# Patient Record
Sex: Male | Born: 1955 | Race: White | Hispanic: No | Marital: Single | Smoking: Never smoker
Health system: Southern US, Community
[De-identification: ages and names within clinical notes are randomized; demographics above are authoritative.]

## PROBLEM LIST (undated history)

## (undated) DIAGNOSIS — E785 Hyperlipidemia, unspecified: Secondary | ICD-10-CM

## (undated) DIAGNOSIS — E119 Type 2 diabetes mellitus without complications: Secondary | ICD-10-CM

## (undated) DIAGNOSIS — E559 Vitamin D deficiency, unspecified: Secondary | ICD-10-CM

## (undated) DIAGNOSIS — R0989 Other specified symptoms and signs involving the circulatory and respiratory systems: Secondary | ICD-10-CM

## (undated) DIAGNOSIS — H409 Unspecified glaucoma: Secondary | ICD-10-CM

## (undated) HISTORY — DX: Type 2 diabetes mellitus without complications: E11.9

## (undated) HISTORY — DX: Other specified symptoms and signs involving the circulatory and respiratory systems: R09.89

## (undated) HISTORY — DX: Unspecified glaucoma: H40.9

## (undated) HISTORY — DX: Vitamin D deficiency, unspecified: E55.9

## (undated) HISTORY — DX: Hyperlipidemia, unspecified: E78.5

---
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
## 2002-11-07 HISTORY — PX: HERNIA REPAIR: SHX51

## 2003-06-24 ENCOUNTER — Ambulatory Visit (HOSPITAL_COMMUNITY): Admission: RE | Admit: 2003-06-24 | Discharge: 2003-06-24 | Payer: Self-pay | Admitting: *Deleted

## 2007-03-21 ENCOUNTER — Ambulatory Visit: Payer: Self-pay | Admitting: Gastroenterology

## 2007-03-29 ENCOUNTER — Ambulatory Visit: Payer: Self-pay | Admitting: Gastroenterology

## 2011-03-25 NOTE — Op Note (Signed)
   NAME:  Patrick Mcintosh, Patrick Mcintosh                             ACCOUNT NO.:  [id number]   MEDICAL RECORD NO.:  [id number]                   PATIENT TYPE:  AMB   LOCATION:  DAY                                  FACILITY:  [facility]   PHYSICIAN:  Vikki Ports, M.D.         DATE OF BIRTH:  [birth year]   DATE OF PROCEDURE:  06/24/2003  DATE OF DISCHARGE:                                 OPERATIVE REPORT   PREOPERATIVE DIAGNOSIS:  Ventral hernia incarcerated.   POSTOPERATIVE DIAGNOSIS:  Ventral hernia incarcerated.   PROCEDURE:  Laparoscopic ventral hernia repair with mesh.   SURGEON:  Vikki Ports, M.D.   ASSISTANT:  None.   ANESTHESIA:  General.   DESCRIPTION OF PROCEDURE:  The patient was taken to the operating room,  placed in the supine position and after adequate general anesthesia was  induced, the abdomen was prepped and draped in the normal sterile fashion.  Using a 10 mm incision in the left upper quadrant and the Optiview zero  scope technique, the camera was introduced into the abdomen.  Pneumoperitoneum was then obtained with continuous flow carbon dioxide.  Under direct vision, two 5 mm ports were placed in the left abdomen. Hernia  contents were then reduced using blunt and sharp dissection and this all  contained omentum. This was reduced into the abdominal cavity, the hernia  defect was measured to be 4 x 6 cm. A piece of 4 inch x 6 inch Parietex mesh  was then tacked with 0 Novofil sutures and placed in the abdomen. It was  tacked to the anterior abdominal wall with the store suture passer. This  covered the defect well with 2-3 cm in every direction. It was then packed  in place using the auto suture tacker. Pneumoperitoneum was released,  trocars were removed and the skin was closed with subcuticular 4-0 Monocryl  and injected with Marcaine. The patient tolerated the procedure well and  went to PACU in good condition.      Vikki Ports, M.D.    KRH/MEDQ  D:  06/24/2003  T:  06/24/2003  Job:  [id number]

## 2013-11-25 DIAGNOSIS — E785 Hyperlipidemia, unspecified: Secondary | ICD-10-CM | POA: Insufficient documentation

## 2013-11-25 DIAGNOSIS — H409 Unspecified glaucoma: Secondary | ICD-10-CM | POA: Insufficient documentation

## 2013-11-25 DIAGNOSIS — E559 Vitamin D deficiency, unspecified: Secondary | ICD-10-CM | POA: Insufficient documentation

## 2013-11-25 DIAGNOSIS — E119 Type 2 diabetes mellitus without complications: Secondary | ICD-10-CM | POA: Insufficient documentation

## 2013-11-25 DIAGNOSIS — R0989 Other specified symptoms and signs involving the circulatory and respiratory systems: Secondary | ICD-10-CM | POA: Insufficient documentation

## 2013-11-26 ENCOUNTER — Encounter: Payer: Self-pay | Admitting: Internal Medicine

## 2014-11-26 ENCOUNTER — Encounter: Payer: Self-pay | Admitting: Internal Medicine

## 2016-06-07 ENCOUNTER — Emergency Department (HOSPITAL_COMMUNITY)
Admission: EM | Admit: 2016-06-07 | Discharge: 2016-06-07 | Disposition: A | Discharge status: Home / Self Care | Payer: Self-pay | Attending: Emergency Medicine | Admitting: Emergency Medicine

## 2016-06-07 ENCOUNTER — Encounter (HOSPITAL_COMMUNITY): Payer: Self-pay | Admitting: Emergency Medicine

## 2016-06-07 ENCOUNTER — Emergency Department (HOSPITAL_COMMUNITY): Payer: Self-pay

## 2016-06-07 DIAGNOSIS — I10 Essential (primary) hypertension: Secondary | ICD-10-CM | POA: Insufficient documentation

## 2016-06-07 DIAGNOSIS — E119 Type 2 diabetes mellitus without complications: Secondary | ICD-10-CM | POA: Insufficient documentation

## 2016-06-07 DIAGNOSIS — K047 Periapical abscess without sinus: Secondary | ICD-10-CM | POA: Insufficient documentation

## 2016-06-07 DIAGNOSIS — Z79899 Other long term (current) drug therapy: Secondary | ICD-10-CM | POA: Insufficient documentation

## 2016-06-07 LAB — CBC
HEMATOCRIT: 44 % (ref 39.0–52.0)
Hemoglobin: 15.8 g/dL (ref 13.0–17.0)
MCH: 32.8 pg (ref 26.0–34.0)
MCHC: 35.9 g/dL (ref 30.0–36.0)
MCV: 91.3 fL (ref 78.0–100.0)
PLATELETS: 254 10*3/uL (ref 150–400)
RBC: 4.82 MIL/uL (ref 4.22–5.81)
RDW: 11.7 % (ref 11.5–15.5)
WBC: 9.7 10*3/uL (ref 4.0–10.5)

## 2016-06-07 LAB — BASIC METABOLIC PANEL
Anion gap: 13 (ref 5–15)
BUN: 9 mg/dL (ref 6–20)
CALCIUM: 8.9 mg/dL (ref 8.9–10.3)
CO2: 23 mmol/L (ref 22–32)
CREATININE: 0.77 mg/dL (ref 0.61–1.24)
Chloride: 97 mmol/L — ABNORMAL LOW (ref 101–111)
GFR calc Af Amer: >60 mL/min (ref >60)
GLUCOSE: 256 mg/dL — AB (ref 65–99)
Potassium: 4 mmol/L (ref 3.5–5.1)
SODIUM: 133 mmol/L — AB (ref 135–145)

## 2016-06-07 MED ORDER — IBUPROFEN 400 MG PO TABS: 400.0000 mg | ORAL_TABLET | Freq: Four times a day (QID) | ORAL | 0 refills | Status: DC | PRN

## 2016-06-07 MED ORDER — KETOROLAC TROMETHAMINE 30 MG/ML IJ SOLN
30.0000 mg | Freq: Once | INTRAMUSCULAR | Status: AC
  Administered 2016-06-07: 30 mg via INTRAVENOUS
  Filled 2016-06-07: qty 1

## 2016-06-07 MED ORDER — PENICILLIN V POTASSIUM 500 MG PO TABS
500.0000 mg | ORAL_TABLET | Freq: Once | ORAL | Status: AC
  Administered 2016-06-07: 500 mg via ORAL
  Filled 2016-06-07: qty 1

## 2016-06-07 MED ORDER — ACETAMINOPHEN 500 MG PO TABS
1000.0000 mg | ORAL_TABLET | Freq: Once | ORAL | Status: AC
  Administered 2016-06-07: 1000 mg via ORAL
  Filled 2016-06-07: qty 2

## 2016-06-07 MED ORDER — IOPAMIDOL (ISOVUE-300) INJECTION 61%
75.0000 mL | Freq: Once | INTRAVENOUS | Status: AC | PRN
  Administered 2016-06-07: 75 mL via INTRAVENOUS

## 2016-06-07 MED ORDER — BUPIVACAINE HCL (PF) 0.5 % IJ SOLN
20.0000 mL | Freq: Once | INTRAMUSCULAR | Status: AC
  Administered 2016-06-07: 10 mL
  Filled 2016-06-07: qty 30

## 2016-06-07 MED ORDER — HYDROCODONE-ACETAMINOPHEN 5-325 MG PO TABS: 1.0000 | ORAL_TABLET | Freq: Four times a day (QID) | ORAL | 0 refills | Status: DC | PRN

## 2016-06-07 MED ORDER — SODIUM CHLORIDE 0.9 % IV BOLUS (SEPSIS)
1000.0000 mL | Freq: Once | INTRAVENOUS | Status: AC
Start: 2016-06-07 — End: 2016-06-07
  Administered 2016-06-07: 1000 mL via INTRAVENOUS

## 2016-06-07 MED ORDER — PENICILLIN V POTASSIUM 500 MG PO TABS: 500.0000 mg | ORAL_TABLET | Freq: Four times a day (QID) | ORAL | 0 refills | Status: DC

## 2016-06-07 NOTE — ED Notes (Signed)
Pt in CT.

## 2016-06-07 NOTE — ED Notes (Signed)
Dr. Venora Maples made aware of supplies in the room.

## 2016-06-07 NOTE — ED Provider Notes (Signed)
[facility] DEPT Provider Note   CSN: [id number] Arrival date & time: 06/07/16  [id number]  First Provider Contact:  First MD Initiated Contact with Patient 06/07/16 [phone]        History   Chief Complaint Chief Complaint  Patient presents with  . Oral Swelling    HPI Patrick Mcintosh is a 60 y.o. male.  Patient reports increasing dental pain over the past week and now has developed facial swelling of the past 24 hours.  He reports this is left-sided facial swelling and left-sided dental pain.  He states he recently lost his job and lost his insurance so he has not seen a Pharmacist, community.  He denies fever or chills.  His pain is moderate in severity.  He has some trismus.  He states no difficulty breathing or swallowing at this time.   The history is provided by the patient and medical records.    Past Medical History:  Diagnosis Date  . Glaucoma   . Hyperlipidemia   . Labile hypertension   . Type II or unspecified type diabetes mellitus without mention of complication, not stated as uncontrolled   . Vitamin D deficiency     Patient Active Problem List   Diagnosis Date Noted  . Labile hypertension   . Hyperlipidemia   . Type II or unspecified type diabetes mellitus without mention of complication, not stated as uncontrolled   . Vitamin D deficiency   . Glaucoma     Past Surgical History:  Procedure Laterality Date  . HERNIA REPAIR  2004  . [place] Medications    Prior to Admission medications   Medication Sig Start Date End Date Taking? Authorizing Provider  benzocaine (ORAJEL) 10 % mucosal gel Use as directed 1 application in the mouth or throat as needed for mouth pain.   Yes Historical Provider, MD  cetirizine (ZYRTEC) 10 MG tablet Take 10 mg by mouth daily.   Yes Historical Provider, MD  naproxen sodium (ANAPROX) 220 MG tablet Take 440 mg by mouth 3 (three) times daily with meals as needed (dental pain.).   Yes Historical Provider, MD    HYDROcodone-acetaminophen (NORCO/VICODIN) 5-325 MG tablet Take 1 tablet by mouth every 6 (six) hours as needed for moderate pain. 06/07/16   Jola Schmidt, MD  ibuprofen (ADVIL,MOTRIN) 400 MG tablet Take 1 tablet (400 mg total) by mouth every 6 (six) hours as needed. 06/07/16   Jola Schmidt, MD  penicillin v potassium (VEETID) 500 MG tablet Take 1 tablet (500 mg total) by mouth 4 (four) times daily. 06/07/16   Jola Schmidt, MD    Family History Family History  Problem Relation Age of Onset  . Pneumonia Father     Social History Social History  Substance Use Topics  . Smoking status: Never Smoker  . Smokeless tobacco: Never Used  . Alcohol use Yes     Comment: occ beer     Allergies   Review of patient's allergies indicates no known allergies.   Review of Systems Review of Systems  All other systems reviewed and are negative.    Physical Exam Updated Vital Signs BP 126/80 (BP Location: Left Arm)   Pulse 109   Temp 99 F (37.2 C) (Oral)   Resp 18   Ht 5\' 10"  (1.778 m)   Wt 170 lb (77.1 kg)   SpO2 95%   BMI 24.39 kg/m   Physical Exam  Constitutional: He is oriented to person,  place, and time. He appears well-developed and well-nourished.  HENT:  Head: Normocephalic.  Poor dentition throughout.  Mild facial swelling on the left with some swelling just under his mandible at the angle on the left.  No swelling under his tongue.  Anterior neck is soft without erythema.  No obvious gingival fluctuance  Eyes: EOM are normal.  Neck: Normal range of motion.  Pulmonary/Chest: Effort normal.  Abdominal: He exhibits no distension.  Musculoskeletal: Normal range of motion.  Neurological: He is alert and oriented to person, place, and time.  Psychiatric: He has a normal mood and affect.  Nursing note and vitals reviewed.    ED Treatments / Results  Labs (all labs ordered are listed, but only abnormal results are displayed) Labs Reviewed  BASIC METABOLIC PANEL - Abnormal;  Notable for the following:       Result Value   Sodium 133 (*)    Chloride 97 (*)    Glucose, Bld 256 (*)    All other components within normal limits  CBC    EKG  EKG Interpretation None       Radiology Ct Soft Tissue Neck W Contrast  Result Date: 06/07/2016 CLINICAL DATA:  LEFT lower dental pain and swelling. EXAM: CT NECK WITH CONTRAST TECHNIQUE: Multidetector CT imaging of the neck was performed using the standard protocol following the bolus administration of intravenous contrast. CONTRAST:  32mL ISOVUE-300 IOPAMIDOL (ISOVUE-300) INJECTION 61% COMPARISON:  None. FINDINGS: Pharynx and larynx: Periapical lucencies are seen in the LEFT mandible surrounding the LEFT lobe the second molar, there is also a large dental caries associated with this tooth. In the LEFT masticator and parapharyngeal spaces, there is significant inflammatory change, extending into the LEFT submandibular space, over the LEFT neck. Small area of hypoattenuation, medial to the LEFT mandibular ramus, 10 x 12 mm, could represent developing abscess. Mass effect on the airway. No impending airway obstruction however. Fullness of the LEFT aryepiglottic fold does not extend to the larynx or below. Normal epiglottis. Within limits for assessment due to dental amalgam, normal sublingual space. Salivary glands: LEFT submandibular gland is in developed by inflammatory change, may be slightly enlarged, but is otherwise intrinsically normal. Thyroid: Unremarkable Lymph nodes: Reactive level 1 and level 2 nodes bilaterally, greater on the LEFT. Vascular: Unremarkable. Limited intracranial: Unremarkable Visualized orbits: Unremarkable Mastoids and visualized paranasal sinuses: No layering fluid. Skeleton: Spondylosis.  No worrisome osseous lesion. Upper chest: No nodules or infiltrates. IMPRESSION: Regional inflammatory change surrounding LEFT second mandibular molar dental disease. Possible small abscess medial to the LEFT mandibular  ramus. Extensive inflammatory change in the LEFT parapharyngeal, masticator, and submandibular spaces, with mild mass effect on the airway. See discussion above. Electronically Signed   By: Staci Righter M.D.   On: 06/07/2016 12:42    Procedures .Nerve Block Performed by: Jola Schmidt Authorized by: Jola Schmidt    Consent: Verbal consent obtained. Required items: required blood products, implants, devices, and special equipment available Time out: Immediately prior to procedure a "time out" was called to verify the correct patient, procedure, equipment, support staff and site/side marked as required. Indication: Dental pain  Nerve block body site: Dental nerve left  Preparation: Patient was prepped and draped in the usual sterile fashion. Needle gauge: 24 G Location technique: anatomical landmarks Local anesthetic: Marcaine  Anesthetic total: 3 ml Outcome: pain improved Patient tolerance: Patient tolerated the procedure well with no immediate complications.     Medications Ordered in ED Medications  bupivacaine (MARCAINE) 0.5 %  injection 20 mL (10 mLs Infiltration Given 06/07/16 1021)  penicillin v potassium (VEETID) tablet 500 mg (500 mg Oral Given 06/07/16 1020)  iopamidol (ISOVUE-300) 61 % injection 75 mL (75 mLs Intravenous Contrast Given 06/07/16 1211)  ketorolac (TORADOL) 30 MG/ML injection 30 mg (30 mg Intravenous Given 06/07/16 1244)  acetaminophen (TYLENOL) tablet 1,000 mg (1,000 mg Oral Given 06/07/16 1245)  sodium chloride 0.9 % bolus 1,000 mL (1,000 mLs Intravenous New Bag/Given 06/07/16 1242)     Initial Impression / Assessment and Plan / ED Course  I have reviewed the triage vital signs and the nursing notes.  Pertinent labs & imaging results that were available during my care of the patient were reviewed by me and considered in my medical decision making (see chart for details).  Clinical Course    Patient will need close follow-up with a dentist.  I do not think he  needs admission to the hospital at this time.  No stridor or airway compromise.  Patient understands to return to the ER for new or worsening symptoms.  Patient will be started on antibiotics.  No clear-cut abscess that needs drainage at this time.  Final Clinical Impressions(s) / ED Diagnoses   Final diagnoses:  Dental infection    New Prescriptions New Prescriptions   HYDROCODONE-ACETAMINOPHEN (NORCO/VICODIN) 5-325 MG TABLET    Take 1 tablet by mouth every 6 (six) hours as needed for moderate pain.   IBUPROFEN (ADVIL,MOTRIN) 400 MG TABLET    Take 1 tablet (400 mg total) by mouth every 6 (six) hours as needed.   PENICILLIN V POTASSIUM (VEETID) 500 MG TABLET    Take 1 tablet (500 mg total) by mouth 4 (four) times daily.     Jola Schmidt, MD 06/07/16 1259

## 2016-06-07 NOTE — ED Triage Notes (Signed)
Patient c/o left lower dental abscess that has increased in swelling and pain over the past couple days.

## 2016-06-27 ENCOUNTER — Other Ambulatory Visit: Payer: Self-pay | Admitting: Internal Medicine

## 2017-02-02 ENCOUNTER — Encounter: Payer: Self-pay | Admitting: Gastroenterology

## 2021-11-22 ENCOUNTER — Ambulatory Visit (INDEPENDENT_AMBULATORY_CARE_PROVIDER_SITE_OTHER): Payer: BC Managed Care – PPO | Admitting: Plastic Surgery

## 2021-11-22 ENCOUNTER — Other Ambulatory Visit: Payer: Self-pay

## 2021-11-22 VITALS — BP 162/105 | HR 89 | Ht 70.0 in | Wt 187.2 lb

## 2021-11-22 DIAGNOSIS — C44320 Squamous cell carcinoma of skin of unspecified parts of face: Secondary | ICD-10-CM

## 2021-11-27 NOTE — Progress Notes (Addendum)
° °  Referring Provider No referring provider defined for this encounter.   CC:  Squamous cell carcinoma left forehead   Patrick Mcintosh is an 66 y.o. male.  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  This was excised today by Dr. Winifred Olive.  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  He wants to continue working and has limited days that he will come in.  He is aware that he will require reconstruction of his left forehead wound with plastics and ENT.  Dr. Winifred Olive has suggested a sentinel node biopsy and will do it we will discuss that with ENT.  No Known Allergies  Outpatient Encounter Medications as of 11/22/2021  Medication Sig   benzocaine (ORAJEL) 10 % mucosal gel Use as directed 1 application in the mouth or throat as needed for mouth pain.   cetirizine (ZYRTEC) 10 MG tablet Take 10 mg by mouth daily.   HYDROcodone-acetaminophen (NORCO/VICODIN) 5-325 MG tablet Take 1 tablet by mouth every 6 (six) hours as needed for moderate pain.   ibuprofen (ADVIL,MOTRIN) 400 MG tablet Take 1 tablet (400 mg total) by mouth every 6 (six) hours as needed.   naproxen sodium (ANAPROX) 220 MG tablet Take 440 mg by mouth 3 (three) times daily with meals as needed (dental pain.).   penicillin v potassium (VEETID) 500 MG tablet Take 1 tablet (500 mg total) by mouth 4 (four) times daily.   No facility-administered encounter medications on file as of 11/22/2021.     Past Medical History:  Diagnosis Date   Glaucoma    Hyperlipidemia    Labile hypertension    Type II or unspecified type diabetes mellitus without mention of complication, not stated as uncontrolled    Vitamin D deficiency     Past Surgical History:  Procedure Laterality Date   HERNIA REPAIR  2004   STRABISMUS SURGERY  1958    Family History  Problem Relation Age of Onset   Pneumonia Father     Social History   Social History Narrative   Not on file     Review of  Systems General: Denies fevers, chills, weight loss CV: Denies chest pain, shortness of breath, palpitations   Physical Exam Vitals with BMI 11/22/2021 06/07/2016 06/07/2016  Height 5\' 10"  - -  Weight 187 lbs 3 oz - -  BMI 78.29 - -  Systolic 562 130 865  Diastolic 784 81 80  Pulse 89 101 109    General:  No acute distress,  Alert and oriented, Non-Toxic, Normal speech and affect HEENT large open wound left forehead exposed bone and a small area.  Assessment/Plan Large open wound with exposed bone left forehead after excision of squamous cell cancer.  Skin graft substitute or skin graft left forehead will plan reconstruction with likely Integra or other skin graft or skin graft substitute.  Since there is exposed bone we will have to cover this with a skin graft substitute or local tissue arrangement, or possible contralateral forehead flap.  We we will plan to coordinate with ENT.  Time based coding: 34minutes were spent with the patient.  Greater than 50% was spent on counseling cordination of care.  We discussed treatment options for reconstruction of left forehead wound.  Lennice Sites 11/27/2021, 2:31 PM

## 2021-12-01 ENCOUNTER — Telehealth: Payer: Self-pay | Admitting: Plastic Surgery

## 2021-12-01 NOTE — Telephone Encounter (Signed)
I called the patient to discuss scheduling of his left forehead reconstruction to be coordinated with ENT, Dr. Constance Holster.  Patient stated he was seeing them on February 14.  He states that he was offered an appointment much sooner on January 19 but he was unable to make this appointment because he was on the road as a Administrator.  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  I gave him my office and mobile phone number so that he could contact me if he is able to move anything up.

## 2021-12-09 ENCOUNTER — Telehealth: Payer: Self-pay | Admitting: Plastic Surgery

## 2021-12-09 NOTE — Telephone Encounter (Signed)
Left message for patient on home number requesting a callback to inform us if he was able to reschedule his appt with ENT to an earlier date as the current appt of 2/14 will delay the surgery further.

## 2021-12-15 ENCOUNTER — Telehealth: Payer: Self-pay | Admitting: Plastic Surgery

## 2021-12-15 NOTE — Telephone Encounter (Signed)
Called patient to see if he had moved his appt up with Dr. Janeice Robinson office yet and he said no, he goes on 2/14 at 2:45. He would like surgery on 2/27 since he'll be back in town that day but it we cannot coordinate the case for the day, then it'll be another 2 weeks before he can be home long enough to do surgery. Advised we will do our very best to schedule this as soon as we can. Patient will let other office know this as well.

## 2021-12-30 ENCOUNTER — Other Ambulatory Visit: Payer: Self-pay

## 2021-12-30 ENCOUNTER — Encounter (HOSPITAL_BASED_OUTPATIENT_CLINIC_OR_DEPARTMENT_OTHER): Payer: Self-pay | Admitting: Otolaryngology

## 2022-01-04 ENCOUNTER — Telehealth: Payer: Self-pay | Admitting: Plastic Surgery

## 2022-01-04 NOTE — Telephone Encounter (Signed)
Spoke with Angela Nevin, surgery scheduler for Dr. Janeice Robinson office about date for surgery. 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 Based on the two surgeon's availability to coordinate, April 3 is the first date that meets patient's availability as well. Patient has been informed of the importance of having the open wound operated on.

## 2022-01-10 ENCOUNTER — Ambulatory Visit (HOSPITAL_BASED_OUTPATIENT_CLINIC_OR_DEPARTMENT_OTHER): Admission: RE | Admit: 2022-01-10 | Payer: BC Managed Care – PPO | Source: Home / Self Care | Admitting: Otolaryngology

## 2022-01-10 DIAGNOSIS — R0989 Other specified symptoms and signs involving the circulatory and respiratory systems: Secondary | ICD-10-CM

## 2022-01-10 SURGERY — EXCISION, MASS, HEAD
Anesthesia: General | Laterality: Left

## 2022-01-20 NOTE — H&P (Signed)
HPI:  ? ?Patrick Mcintosh is a 66 y.o. male who presents as a consult Patient.  ? ?Referring Provider: Pcp, Verify Patient Has ? ?Chief complaint: Skin cancer. ? ?HPI: Squamous cell cancer left forehead scalp skin. Referred here by plastic surgery for consideration of sentinel node biopsy. He is going to be scheduled for excision and reconstruction with Integra. He does not smoke. He is diabetic but reasonably well controlled. 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 He had 1 other skin cancer about 10 years prior. I do not have the pathology results. ? ?PMH/Meds/All/SocHx/FamHx/ROS:  ? ?History reviewed. No pertinent past medical history. ? ?History reviewed. No pertinent surgical history. ? ?No family history of bleeding disorders, wound healing problems or difficulty with anesthesia.  ? ?Social History  ? ?Socioeconomic History  ? Marital status: Unknown  ?Spouse name: Not on file  ? Number of children: Not on file  ? Years of education: Not on file  ? Highest education level: Not on file  ?Occupational History  ? Not on file  ?Tobacco Use  ? Smoking status: Never  ? Smokeless tobacco: Never  ?Substance and Sexual Activity  ? Alcohol use: Yes  ? Drug use: Not on file  ? Sexual activity: Not on file  ?Other Topics Concern  ? Not on file  ?Social History Narrative  ? Not on file  ? ?Social Determinants of Health  ? ?Financial Resource Strain: Not on file  ?Food Insecurity: Not on file  ?Transportation Needs: Not on file  ?Physical Activity: Not on file  ?Stress: Not on file  ?Social Connections: Not on file  ?Housing Stability: Not on file  ? ?Current Outpatient Medications:  ? atorvastatin (LIPITOR) 10 MG tablet, Take 1 tablet (10 mg total) by mouth nightly., Disp: , Rfl:  ? metFORMIN (GLUCOPHAGE) 1000 MG tablet, Take 1 tablet (1,000 mg total) by mouth 2 times daily., Disp: , Rfl:  ? ?A complete ROS was performed with pertinent positives/negatives noted in the HPI. The remainder of the ROS are  negative. ? ? ?Physical Exam:  ? ?Temp 98.5 ?F (36.9 ?C)  Ht 1.778 m ('5\' 10"'$ )  Wt 85.2 kg (187 lb 12.8 oz)  BMI 26.95 kg/m?  ? ?General: Healthy and alert, in no distress, breathing easily. Normal affect. In a pleasant mood. ?Head: Normocephalic, atraumatic. No masses, or scars. ?Eyes: Pupils are equal, and reactive to light. Vision is grossly intact. No spontaneous or gaze nystagmus. ?Ears: Ear canals are clear. Tympanic membranes are intact, with normal landmarks and the middle ears are clear and healthy. ?Hearing: Grossly normal. ?Nose: Nasal cavities are clear with healthy mucosa, no polyps or exudate. Airways are patent. ?Face: No masses or scars, facial nerve function is symmetric. ?Oral Cavity: No mucosal abnormalities are noted. Tongue with normal mobility. Dentition appears healthy. ?Oropharynx: Tonsils are symmetric. There are no mucosal masses identified. Tongue base appears normal and healthy. ?Larynx/Hypopharynx: deferred ?Chest: Deferred ?Neck: No palpable masses, no cervical adenopathy, no thyroid nodules or enlargement. ?Neuro: Cranial nerves II-XII with normal function. ?Balance: Normal gate. ?Other findings: none. ? ?Independent Review of Additional Tests or Records:  ?none ? ?Procedures:  ?Procedure note: ? ?Indications: Cerumen impaction ? ?Details of cerumen removal were discussed with the patient and all questions were answered. ? ?Procedure: ? ?Using the operating microscope, both sides were cleaned of cerumen using curettes. There was no signs of infection. Symptoms were releaved. ? ?He tolerated this procedure well. ?There were no  complications. ? ?Impression & Plans:  ?No palpable lymphadenopathy. Concern arose for sentinel node biopsy. I will make arrangements for a lymphoscintigram after I have reviewed the pathology. We can coordinate then to perform sentinel node biopsy at the same time as the plastic surgery procedure. ? ?

## 2022-01-24 ENCOUNTER — Ambulatory Visit (INDEPENDENT_AMBULATORY_CARE_PROVIDER_SITE_OTHER): Payer: BC Managed Care – PPO | Admitting: Plastic Surgery

## 2022-01-24 DIAGNOSIS — C44320 Squamous cell carcinoma of skin of unspecified parts of face: Secondary | ICD-10-CM

## 2022-01-24 NOTE — Progress Notes (Signed)
? ?    ? ?    Patient ID: Patrick Mcintosh, male    DOB: Oct 03, 1956, 66 y.o.   MRN: [id number] ? ?Chief complaint: Squamous cell cancer of left forehead ? ? ?History of Present Illness: ?Patrick Mcintosh is a 66 y.o.  male  with a history of left temple squamous cell carcinoma.  He presents for preoperative evaluation for upcoming procedure, left forehead reconstruction with graft (likely  Integra) and burring of focal margin along bone, scheduled for 02/07/22 with Dr.  Erin Hearing . ? ?The patient has not had problems with anesthesia.  ? ?Summary of Previous Visit: The patient was first seen on January 16.  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  He is aware that aware that this is delaying his care and could affect his final result and ability to clear the squamous cell cancer. ? ?Job: Trucker ? ?PMH Significant for: Hypertension ? ? ?Past Medical History: ?Allergies: ?No Known Allergies ? ?Current Medications: ? ?Current Outpatient Medications:  ?  atorvastatin (LIPITOR) 10 MG tablet, Take 10 mg by mouth daily., Disp: , Rfl:  ?  metFORMIN (GLUCOPHAGE) 1000 MG tablet, Take 1,000 mg by mouth 2 (two) times daily with a meal., Disp: , Rfl:  ? ?Past Medical Problems: ?Past Medical History:  ?Diagnosis Date  ? Glaucoma   ? Hyperlipidemia   ? Labile hypertension   ? Type II or unspecified type diabetes mellitus without mention of complication, not stated as uncontrolled   ? Vitamin D deficiency   ? ? ?Past Surgical History: ?Past Surgical History:  ?Procedure Laterality Date  ? HERNIA REPAIR  2004  ? [place]  ? ? ?Social History: ?Social History  ? ?Socioeconomic History  ? Marital status: Single  ?  Spouse name: Not on file  ? Number of children: Not on file  ? Years of education: Not on file  ? Highest education level: Not on file  ?Occupational History  ? Not on file  ?Tobacco Use  ? Smoking status: Never  ? Smokeless tobacco: Never  ?Substance and Sexual Activity  ?  Alcohol use: Yes  ?  Comment: occ beer  ? Drug use: No  ? Sexual activity: Not on file  ?Other Topics Concern  ? Not on file  ?Social History Narrative  ? Not on file  ? ?Social Determinants of Health  ? ?Financial Resource Strain: Not on file  ?Food Insecurity: Not on file  ?Transportation Needs: Not on file  ?Physical Activity: Not on file  ?Stress: Not on file  ?Social Connections: Not on file  ?Intimate Partner Violence: Not on file  ? ? ?Family History: ?Family History  ?Problem Relation Age of Onset  ? Pneumonia Father   ? ? ?Review of Systems: ?ROS ? ?Physical Exam: ?Vital Signs ?There were no vitals taken for this visit. ? ?Physical Exam  ?Phone visit.  Patient was unable to provide imaging. ? ?Assessment/Plan: ?The patient is scheduled for burring of focal margin and reconstruction with Dr.  Erin Hearing  as well as sentinel node procedure with Dr. Constance Holster.  Risks, benefits, and alternatives of procedure discussed, questions answered and consent obtained.   ? ?Smoking Status: None; ? ? ?Caprini Score: 6;  Will defer to ENT on prophylaxis choice ? ? ?Post-op Rx sent to pharmacy: oxycodone ? ? ? ? ?Electronically signed by: Lennice Sites, MD 01/24/2022 8:45 AM ?

## 2022-01-24 NOTE — H&P (View-Only) (Signed)
? ?    ? ?    Patient ID: Patrick Mcintosh, male    DOB: 08/16/1956, 66 y.o.   MRN: [id number] ? ?Chief complaint: Squamous cell cancer of left forehead ? ? ?History of Present Illness: ?Patrick Mcintosh is a 66 y.o.  male  with a history of left temple squamous cell carcinoma.  He presents for preoperative evaluation for upcoming procedure, left forehead reconstruction with graft (likely  Integra) and burring of focal margin along bone, scheduled for 02/07/22 with Dr.  Erin Hearing . ? ?The patient has not had problems with anesthesia.  ? ?Summary of Previous Visit: The patient was first seen on January 16.  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  He is aware that aware that this is delaying his care and could affect his final result and ability to clear the squamous cell cancer. ? ?Job: Trucker ? ?PMH Significant for: Hypertension ? ? ?Past Medical History: ?Allergies: ?No Known Allergies ? ?Current Medications: ? ?Current Outpatient Medications:  ?  atorvastatin (LIPITOR) 10 MG tablet, Take 10 mg by mouth daily., Disp: , Rfl:  ?  metFORMIN (GLUCOPHAGE) 1000 MG tablet, Take 1,000 mg by mouth 2 (two) times daily with a meal., Disp: , Rfl:  ? ?Past Medical Problems: ?Past Medical History:  ?Diagnosis Date  ? Glaucoma   ? Hyperlipidemia   ? Labile hypertension   ? Type II or unspecified type diabetes mellitus without mention of complication, not stated as uncontrolled   ? Vitamin D deficiency   ? ? ?Past Surgical History: ?Past Surgical History:  ?Procedure Laterality Date  ? HERNIA REPAIR  2004  ? [place]  ? ? ?Social History: ?Social History  ? ?Socioeconomic History  ? Marital status: Single  ?  Spouse name: Not on file  ? Number of children: Not on file  ? Years of education: Not on file  ? Highest education level: Not on file  ?Occupational History  ? Not on file  ?Tobacco Use  ? Smoking status: Never  ? Smokeless tobacco: Never  ?Substance and Sexual Activity  ?  Alcohol use: Yes  ?  Comment: occ beer  ? Drug use: No  ? Sexual activity: Not on file  ?Other Topics Concern  ? Not on file  ?Social History Narrative  ? Not on file  ? ?Social Determinants of Health  ? ?Financial Resource Strain: Not on file  ?Food Insecurity: Not on file  ?Transportation Needs: Not on file  ?Physical Activity: Not on file  ?Stress: Not on file  ?Social Connections: Not on file  ?Intimate Partner Violence: Not on file  ? ? ?Family History: ?Family History  ?Problem Relation Age of Onset  ? Pneumonia Father   ? ? ?Review of Systems: ?ROS ? ?Physical Exam: ?Vital Signs ?There were no vitals taken for this visit. ? ?Physical Exam  ?Phone visit.  Patient was unable to provide imaging. ? ?Assessment/Plan: ?The patient is scheduled for burring of focal margin and reconstruction with Dr.  Erin Hearing  as well as sentinel node procedure with Dr. Constance Holster.  Risks, benefits, and alternatives of procedure discussed, questions answered and consent obtained.   ? ?Smoking Status: None; ? ? ?Caprini Score: 6;  Will defer to ENT on prophylaxis choice ? ? ?Post-op Rx sent to pharmacy: oxycodone ? ? ? ? ?Electronically signed by: Lennice Sites, MD 01/24/2022 8:45 AM ?

## 2022-01-25 ENCOUNTER — Other Ambulatory Visit (HOSPITAL_COMMUNITY): Payer: Self-pay | Admitting: Otolaryngology

## 2022-01-25 ENCOUNTER — Other Ambulatory Visit: Payer: Self-pay | Admitting: Otolaryngology

## 2022-01-25 ENCOUNTER — Encounter: Payer: Self-pay | Admitting: Plastic Surgery

## 2022-01-25 ENCOUNTER — Telehealth: Payer: Self-pay | Admitting: Plastic Surgery

## 2022-01-25 DIAGNOSIS — C44309 Unspecified malignant neoplasm of skin of other parts of face: Secondary | ICD-10-CM

## 2022-01-25 MED ORDER — OXYCODONE HCL 5 MG PO TABS: 5.0000 mg | ORAL_TABLET | ORAL | 0 refills | Status: DC | PRN

## 2022-01-25 NOTE — Telephone Encounter (Signed)
Returned Carla's call regarding mutual patient that we were coordinating surgery for. Patient does not have anyone to bring him home for surgery or stay with him after surgery. Patient was planning to take UBER to and from surgery. Advised that I would check with [facility] about next step due to the fact patient has had a large wound for more than a month at this point and the surgery is needed. ?

## 2022-01-27 ENCOUNTER — Other Ambulatory Visit: Payer: Self-pay

## 2022-01-27 ENCOUNTER — Encounter (HOSPITAL_BASED_OUTPATIENT_CLINIC_OR_DEPARTMENT_OTHER): Payer: Self-pay | Admitting: Otolaryngology

## 2022-01-31 ENCOUNTER — Ambulatory Visit (HOSPITAL_COMMUNITY)
Admission: RE | Admit: 2022-01-31 | Discharge: 2022-01-31 | Disposition: A | Discharge status: Home / Self Care | Payer: BC Managed Care – PPO | Source: Ambulatory Visit | Attending: Otolaryngology | Admitting: Otolaryngology

## 2022-01-31 ENCOUNTER — Encounter (HOSPITAL_BASED_OUTPATIENT_CLINIC_OR_DEPARTMENT_OTHER)
Admission: RE | Admit: 2022-01-31 | Discharge: 2022-01-31 | Disposition: A | Discharge status: Home / Self Care | Payer: BC Managed Care – PPO | Source: Ambulatory Visit | Attending: Otolaryngology | Admitting: Otolaryngology

## 2022-01-31 ENCOUNTER — Other Ambulatory Visit: Payer: Self-pay

## 2022-01-31 DIAGNOSIS — C44309 Unspecified malignant neoplasm of skin of other parts of face: Secondary | ICD-10-CM | POA: Insufficient documentation

## 2022-01-31 DIAGNOSIS — R599 Enlarged lymph nodes, unspecified: Secondary | ICD-10-CM | POA: Diagnosis not present

## 2022-01-31 DIAGNOSIS — Z01818 Encounter for other preprocedural examination: Secondary | ICD-10-CM | POA: Insufficient documentation

## 2022-01-31 DIAGNOSIS — E119 Type 2 diabetes mellitus without complications: Secondary | ICD-10-CM | POA: Diagnosis not present

## 2022-01-31 DIAGNOSIS — C44329 Squamous cell carcinoma of skin of other parts of face: Secondary | ICD-10-CM | POA: Diagnosis not present

## 2022-01-31 DIAGNOSIS — I10 Essential (primary) hypertension: Secondary | ICD-10-CM | POA: Diagnosis not present

## 2022-01-31 LAB — BASIC METABOLIC PANEL
Anion gap: 6 (ref 5–15)
BUN: 16 mg/dL (ref 8–23)
CO2: 26 mmol/L (ref 22–32)
Calcium: 9.7 mg/dL (ref 8.9–10.3)
Chloride: 103 mmol/L (ref 98–111)
Creatinine, Ser: 0.93 mg/dL (ref 0.61–1.24)
GFR, Estimated: >60 mL/min (ref >60)
Glucose, Bld: 265 mg/dL — ABNORMAL HIGH (ref 70–99)
Potassium: 5.4 mmol/L — ABNORMAL HIGH (ref 3.5–5.1)
Sodium: 135 mmol/L (ref 135–145)

## 2022-01-31 MED ORDER — TECHNETIUM TC 99M TILMANOCEPT KIT
502.0000 | PACK | Freq: Once | INTRAVENOUS | Status: AC | PRN
  Administered 2022-01-31: 502 via INTRADERMAL

## 2022-02-01 NOTE — Progress Notes (Signed)
K+5.4, Dr. Glennon Mac aware, will proceed with surgery as scheduled.  ?

## 2022-02-04 ENCOUNTER — Other Ambulatory Visit: Payer: Self-pay | Admitting: Otolaryngology

## 2022-02-04 ENCOUNTER — Other Ambulatory Visit (HOSPITAL_COMMUNITY): Payer: Self-pay | Admitting: Otolaryngology

## 2022-02-04 DIAGNOSIS — C44309 Unspecified malignant neoplasm of skin of other parts of face: Secondary | ICD-10-CM

## 2022-02-07 ENCOUNTER — Encounter (HOSPITAL_BASED_OUTPATIENT_CLINIC_OR_DEPARTMENT_OTHER): Payer: Self-pay | Admitting: Otolaryngology

## 2022-02-07 ENCOUNTER — Ambulatory Visit (HOSPITAL_BASED_OUTPATIENT_CLINIC_OR_DEPARTMENT_OTHER): Payer: BC Managed Care – PPO | Admitting: Certified Registered"

## 2022-02-07 ENCOUNTER — Ambulatory Visit (HOSPITAL_COMMUNITY)
Admission: RE | Admit: 2022-02-07 | Discharge: 2022-02-07 | Disposition: A | Discharge status: Home / Self Care | Payer: BC Managed Care – PPO | Source: Ambulatory Visit | Attending: Otolaryngology | Admitting: Otolaryngology

## 2022-02-07 ENCOUNTER — Observation Stay (HOSPITAL_BASED_OUTPATIENT_CLINIC_OR_DEPARTMENT_OTHER)
Admission: RE | Admit: 2022-02-07 | Discharge: 2022-02-08 | Disposition: A | Discharge status: Home / Self Care | Payer: BC Managed Care – PPO | Attending: Plastic Surgery | Admitting: Plastic Surgery

## 2022-02-07 ENCOUNTER — Other Ambulatory Visit: Payer: Self-pay

## 2022-02-07 ENCOUNTER — Encounter (HOSPITAL_BASED_OUTPATIENT_CLINIC_OR_DEPARTMENT_OTHER)
Admission: RE | Disposition: A | Discharge status: Home / Self Care | Payer: Self-pay | Source: Home / Self Care | Attending: Plastic Surgery

## 2022-02-07 DIAGNOSIS — R599 Enlarged lymph nodes, unspecified: Secondary | ICD-10-CM | POA: Diagnosis not present

## 2022-02-07 DIAGNOSIS — Z7984 Long term (current) use of oral hypoglycemic drugs: Secondary | ICD-10-CM | POA: Diagnosis not present

## 2022-02-07 DIAGNOSIS — C44329 Squamous cell carcinoma of skin of other parts of face: Secondary | ICD-10-CM

## 2022-02-07 DIAGNOSIS — Z79899 Other long term (current) drug therapy: Secondary | ICD-10-CM | POA: Diagnosis not present

## 2022-02-07 DIAGNOSIS — E119 Type 2 diabetes mellitus without complications: Secondary | ICD-10-CM | POA: Diagnosis not present

## 2022-02-07 DIAGNOSIS — I10 Essential (primary) hypertension: Secondary | ICD-10-CM | POA: Insufficient documentation

## 2022-02-07 DIAGNOSIS — C4440 Unspecified malignant neoplasm of skin of scalp and neck: Secondary | ICD-10-CM

## 2022-02-07 DIAGNOSIS — C44320 Squamous cell carcinoma of skin of unspecified parts of face: Secondary | ICD-10-CM | POA: Diagnosis present

## 2022-02-07 DIAGNOSIS — C44309 Unspecified malignant neoplasm of skin of other parts of face: Secondary | ICD-10-CM

## 2022-02-07 HISTORY — PX: ADJACENT TISSUE TRANSFER/TISSUE REARRANGEMENT: SHX6829

## 2022-02-07 HISTORY — PX: LYMPH NODE BIOPSY: SHX201

## 2022-02-07 LAB — GLUCOSE, CAPILLARY
Glucose-Capillary: 275 mg/dL — ABNORMAL HIGH (ref 70–99)
Glucose-Capillary: 258 mg/dL — ABNORMAL HIGH (ref 70–99)

## 2022-02-07 SURGERY — LYMPH NODE BIOPSY
Anesthesia: General | Site: Neck | Laterality: Left

## 2022-02-07 MED ORDER — FENTANYL CITRATE (PF) 100 MCG/2ML IJ SOLN
INTRAMUSCULAR | Status: DC | PRN
  Administered 2022-02-07: 25 ug via INTRAVENOUS
  Administered 2022-02-07: 100 ug via INTRAVENOUS
  Administered 2022-02-07: 25 ug via INTRAVENOUS
  Administered 2022-02-07: 50 ug via INTRAVENOUS

## 2022-02-07 MED ORDER — ACETAMINOPHEN 500 MG PO TABS
1000.0000 mg | ORAL_TABLET | Freq: Once | ORAL | Status: AC
Start: 2022-02-07 — End: 2022-02-07
  Administered 2022-02-07: 1000 mg via ORAL

## 2022-02-07 MED ORDER — LIDOCAINE HCL (CARDIAC) PF 100 MG/5ML IV SOSY
PREFILLED_SYRINGE | INTRAVENOUS | Status: DC | PRN
  Administered 2022-02-07: 100 mg via INTRAVENOUS

## 2022-02-07 MED ORDER — MIDAZOLAM HCL 2 MG/2ML IJ SOLN
INTRAMUSCULAR | Status: AC
  Filled 2022-02-07: qty 2

## 2022-02-07 MED ORDER — ONDANSETRON HCL 4 MG/2ML IJ SOLN
INTRAMUSCULAR | Status: DC | PRN
  Administered 2022-02-07: 4 mg via INTRAVENOUS

## 2022-02-07 MED ORDER — OXYCODONE HCL 5 MG/5ML PO SOLN: 5.0000 mg | Freq: Once | ORAL | Status: DC | PRN

## 2022-02-07 MED ORDER — FENTANYL CITRATE (PF) 100 MCG/2ML IJ SOLN
INTRAMUSCULAR | Status: AC
Start: 2022-02-07 — End: ?
  Filled 2022-02-07: qty 2

## 2022-02-07 MED ORDER — LIDOCAINE-EPINEPHRINE 1 %-1:100000 IJ SOLN
INTRAMUSCULAR | Status: DC | PRN
Start: 2022-02-07 — End: 2022-02-07
  Administered 2022-02-07: 3 mL

## 2022-02-07 MED ORDER — MIDAZOLAM HCL 5 MG/5ML IJ SOLN
INTRAMUSCULAR | Status: DC | PRN
  Administered 2022-02-07: 2 mg via INTRAVENOUS

## 2022-02-07 MED ORDER — ONDANSETRON 4 MG PO TBDP: 4.0000 mg | ORAL_TABLET | Freq: Four times a day (QID) | ORAL | Status: DC | PRN

## 2022-02-07 MED ORDER — DOCUSATE SODIUM 100 MG PO CAPS: 100.0000 mg | ORAL_CAPSULE | Freq: Two times a day (BID) | ORAL | Status: DC

## 2022-02-07 MED ORDER — ONDANSETRON HCL 4 MG/2ML IJ SOLN
INTRAMUSCULAR | Status: AC
  Filled 2022-02-07: qty 2

## 2022-02-07 MED ORDER — ONDANSETRON HCL 4 MG/2ML IJ SOLN: 4.0000 mg | Freq: Four times a day (QID) | INTRAMUSCULAR | Status: DC | PRN

## 2022-02-07 MED ORDER — FENTANYL CITRATE (PF) 100 MCG/2ML IJ SOLN
INTRAMUSCULAR | Status: AC
  Filled 2022-02-07: qty 2

## 2022-02-07 MED ORDER — PHENYLEPHRINE 40 MCG/ML (10ML) SYRINGE FOR IV PUSH (FOR BLOOD PRESSURE SUPPORT)
PREFILLED_SYRINGE | INTRAVENOUS | Status: DC | PRN
  Administered 2022-02-07 (×2): 120 ug via INTRAVENOUS

## 2022-02-07 MED ORDER — BUPIVACAINE-EPINEPHRINE (PF) 0.25% -1:200000 IJ SOLN
INTRAMUSCULAR | Status: AC
  Filled 2022-02-07: qty 30

## 2022-02-07 MED ORDER — FENTANYL CITRATE (PF) 100 MCG/2ML IJ SOLN: 25.0000 ug | INTRAMUSCULAR | Status: DC | PRN

## 2022-02-07 MED ORDER — PROPOFOL 500 MG/50ML IV EMUL
INTRAVENOUS | Status: AC
  Filled 2022-02-07: qty 50

## 2022-02-07 MED ORDER — ACETAMINOPHEN 500 MG PO TABS: 1000.0000 mg | ORAL_TABLET | Freq: Once | ORAL | Status: DC

## 2022-02-07 MED ORDER — AMISULPRIDE (ANTIEMETIC) 5 MG/2ML IV SOLN: 10.0000 mg | Freq: Once | INTRAVENOUS | Status: DC | PRN

## 2022-02-07 MED ORDER — DEXAMETHASONE SODIUM PHOSPHATE 10 MG/ML IJ SOLN
INTRAMUSCULAR | Status: AC
  Filled 2022-02-07: qty 1

## 2022-02-07 MED ORDER — METHYLENE BLUE 0.5 % INJ SOLN
INTRAVENOUS | Status: AC
  Filled 2022-02-07: qty 10

## 2022-02-07 MED ORDER — FENTANYL CITRATE (PF) 100 MCG/2ML IJ SOLN
100.0000 ug | Freq: Once | INTRAMUSCULAR | Status: DC
Start: 2022-02-07 — End: 2022-02-07

## 2022-02-07 MED ORDER — CEFAZOLIN SODIUM-DEXTROSE 2-4 GM/100ML-% IV SOLN
2.0000 g | INTRAVENOUS | Status: AC
  Administered 2022-02-07: 2 g via INTRAVENOUS

## 2022-02-07 MED ORDER — ACETAMINOPHEN 500 MG PO TABS
ORAL_TABLET | ORAL | Status: AC
  Filled 2022-02-07: qty 2

## 2022-02-07 MED ORDER — BUPIVACAINE HCL (PF) 0.5 % IJ SOLN
INTRAMUSCULAR | Status: AC
  Filled 2022-02-07: qty 30

## 2022-02-07 MED ORDER — LIDOCAINE-EPINEPHRINE 1 %-1:100000 IJ SOLN
INTRAMUSCULAR | Status: AC
  Filled 2022-02-07: qty 1

## 2022-02-07 MED ORDER — SUGAMMADEX SODIUM 200 MG/2ML IV SOLN
INTRAVENOUS | Status: DC | PRN
  Administered 2022-02-07: 200 mg via INTRAVENOUS

## 2022-02-07 MED ORDER — HYDROMORPHONE HCL 1 MG/ML IJ SOLN: 1.0000 mg | INTRAMUSCULAR | Status: DC | PRN

## 2022-02-07 MED ORDER — CHLORHEXIDINE GLUCONATE CLOTH 2 % EX PADS: 6.0000 | MEDICATED_PAD | Freq: Once | CUTANEOUS | Status: DC

## 2022-02-07 MED ORDER — SUGAMMADEX SODIUM 500 MG/5ML IV SOLN
INTRAVENOUS | Status: AC
  Filled 2022-02-07: qty 5

## 2022-02-07 MED ORDER — BUPIVACAINE HCL (PF) 0.25 % IJ SOLN
INTRAMUSCULAR | Status: AC
  Filled 2022-02-07: qty 30

## 2022-02-07 MED ORDER — LACTATED RINGERS IV SOLN: INTRAVENOUS | Status: DC

## 2022-02-07 MED ORDER — TECHNETIUM TC 99M TILMANOCEPT KIT
515.0000 | PACK | Freq: Once | INTRAVENOUS | Status: AC | PRN
Start: 2022-02-07 — End: 2022-02-07
  Administered 2022-02-07: 515 via INTRADERMAL

## 2022-02-07 MED ORDER — PHENYLEPHRINE 40 MCG/ML (10ML) SYRINGE FOR IV PUSH (FOR BLOOD PRESSURE SUPPORT)
PREFILLED_SYRINGE | INTRAVENOUS | Status: AC
  Filled 2022-02-07: qty 10

## 2022-02-07 MED ORDER — ROCURONIUM BROMIDE 10 MG/ML (PF) SYRINGE
PREFILLED_SYRINGE | INTRAVENOUS | Status: AC
  Filled 2022-02-07: qty 10

## 2022-02-07 MED ORDER — ROCURONIUM BROMIDE 100 MG/10ML IV SOLN
INTRAVENOUS | Status: DC | PRN
  Administered 2022-02-07: 60 mg via INTRAVENOUS

## 2022-02-07 MED ORDER — PROPOFOL 10 MG/ML IV BOLUS
INTRAVENOUS | Status: DC | PRN
  Administered 2022-02-07: 150 mg via INTRAVENOUS

## 2022-02-07 MED ORDER — ACETAMINOPHEN 500 MG PO TABS
1000.0000 mg | ORAL_TABLET | Freq: Four times a day (QID) | ORAL | Status: DC
  Administered 2022-02-07 – 2022-02-08 (×2): 1000 mg via ORAL
  Filled 2022-02-07 (×2): qty 2

## 2022-02-07 MED ORDER — LIDOCAINE 2% (20 MG/ML) 5 ML SYRINGE
INTRAMUSCULAR | Status: AC
  Filled 2022-02-07: qty 5

## 2022-02-07 MED ORDER — OXYCODONE HCL 5 MG PO TABS
5.0000 mg | ORAL_TABLET | ORAL | Status: DC | PRN
  Administered 2022-02-07: 5 mg via ORAL
  Administered 2022-02-07: 10 mg via ORAL
  Filled 2022-02-07: qty 2
  Filled 2022-02-07: qty 1

## 2022-02-07 MED ORDER — CEFAZOLIN SODIUM-DEXTROSE 2-4 GM/100ML-% IV SOLN
INTRAVENOUS | Status: AC
  Filled 2022-02-07: qty 100

## 2022-02-07 MED ORDER — OXYCODONE HCL 5 MG PO TABS: 5.0000 mg | ORAL_TABLET | Freq: Once | ORAL | Status: DC | PRN

## 2022-02-07 SURGICAL SUPPLY — 56 items
ADH SKN CLS APL DERMABOND .7 (GAUZE/BANDAGES/DRESSINGS) ×6
BLADE SURG 11 STRL SS (BLADE) ×2 IMPLANT
BLADE SURG 15 STRL LF DISP TIS (BLADE) ×6 IMPLANT
BLADE SURG 15 STRL SS (BLADE) ×8
BUR EGG 3PK/BX (BURR) ×2 IMPLANT
CANISTER SUCT 1200ML W/VALVE (MISCELLANEOUS) ×4 IMPLANT
CLEANER CAUTERY TIP 5X5 PAD (MISCELLANEOUS) ×3 IMPLANT
CORD BIPOLAR FORCEPS 12FT (ELECTRODE) ×2 IMPLANT
COVER BACK TABLE 60X90IN (DRAPES) ×4 IMPLANT
COVER MAYO STAND STRL (DRAPES) ×4 IMPLANT
COVER PROBE W GEL 5X96 (DRAPES) ×4 IMPLANT
DERMABOND ADVANCED (GAUZE/BANDAGES/DRESSINGS) ×2
DERMABOND ADVANCED .7 DNX12 (GAUZE/BANDAGES/DRESSINGS) ×2 IMPLANT
DRAPE U-SHAPE 76X120 STRL (DRAPES) ×8 IMPLANT
DRESSING MEPILEX FLEX 4X4 (GAUZE/BANDAGES/DRESSINGS) ×1 IMPLANT
DRSG MEPILEX FLEX 4X4 (GAUZE/BANDAGES/DRESSINGS) ×4
ELECT COATED BLADE 2.86 ST (ELECTRODE) ×4 IMPLANT
ELECT NDL BLADE 2-5/6 (NEEDLE) IMPLANT
ELECT NEEDLE BLADE 2-5/6 (NEEDLE) ×4 IMPLANT
ELECT REM PT RETURN 9FT ADLT (ELECTROSURGICAL) ×4
ELECTRODE REM PT RTRN 9FT ADLT (ELECTROSURGICAL) ×3 IMPLANT
GAUZE 4X4 16PLY ~~LOC~~+RFID DBL (SPONGE) ×2 IMPLANT
GLOVE SRG 8 PF TXTR STRL LF DI (GLOVE) ×3 IMPLANT
GLOVE SURG ENC MOIS LTX SZ7.5 (GLOVE) ×4 IMPLANT
GLOVE SURG ENC TEXT LTX SZ7.5 (GLOVE) ×4 IMPLANT
GLOVE SURG POLYISO LF SZ7 (GLOVE) ×2 IMPLANT
GLOVE SURG SYN 7.5  E (GLOVE) ×4
GLOVE SURG SYN 7.5 E (GLOVE) ×3 IMPLANT
GLOVE SURG SYN 7.5 PF PI (GLOVE) ×2 IMPLANT
GLOVE SURG UNDER POLY LF SZ6.5 (GLOVE) ×6 IMPLANT
GLOVE SURG UNDER POLY LF SZ7 (GLOVE) ×2 IMPLANT
GLOVE SURG UNDER POLY LF SZ8 (GLOVE) ×4
GOWN STRL REUS W/ TWL LRG LVL3 (GOWN DISPOSABLE) ×5 IMPLANT
GOWN STRL REUS W/ TWL XL LVL3 (GOWN DISPOSABLE) ×6 IMPLANT
GOWN STRL REUS W/TWL LRG LVL3 (GOWN DISPOSABLE) ×4
GOWN STRL REUS W/TWL XL LVL3 (GOWN DISPOSABLE) ×8
NDL HYPO 27GX1-1/4 (NEEDLE) ×2 IMPLANT
NEEDLE HYPO 27GX1-1/4 (NEEDLE) ×4 IMPLANT
NS IRRIG 1000ML POUR BTL (IV SOLUTION) ×2 IMPLANT
PACK BASIN DAY SURGERY FS (CUSTOM PROCEDURE TRAY) ×4 IMPLANT
PAD CLEANER CAUTERY TIP 5X5 (MISCELLANEOUS) ×1
PENCIL FOOT CONTROL (ELECTRODE) ×4 IMPLANT
PENCIL SMOKE EVACUATOR (MISCELLANEOUS) ×4 IMPLANT
SLEEVE SCD COMPRESS KNEE MED (STOCKING) ×2 IMPLANT
SUCTION FRAZIER HANDLE 10FR (MISCELLANEOUS) ×4
SUCTION TUBE FRAZIER 10FR DISP (MISCELLANEOUS) ×1 IMPLANT
SUT CHROMIC 3 0 PS 2 (SUTURE) ×2 IMPLANT
SUT PROLENE 2 0 CT2 30 (SUTURE) ×2 IMPLANT
SUT PROLENE 4 0 PS 2 18 (SUTURE) ×2 IMPLANT
SUT SILK 3 0 TIES 17X18 (SUTURE) ×4
SUT SILK 3-0 18XBRD TIE BLK (SUTURE) ×1 IMPLANT
SYR BULB EAR ULCER 3OZ GRN STR (SYRINGE) ×2 IMPLANT
SYR CONTROL 10ML LL (SYRINGE) ×4 IMPLANT
TOWEL GREEN STERILE FF (TOWEL DISPOSABLE) ×8 IMPLANT
TRAY DSU PREP LF (CUSTOM PROCEDURE TRAY) ×4 IMPLANT
TUBE CONNECTING 20X1/4 (TUBING) ×4 IMPLANT

## 2022-02-07 NOTE — Progress Notes (Signed)
Nuc med inj performed to left forehead wound by nuc med staff. Pt tol well. No sedation given ?

## 2022-02-07 NOTE — Anesthesia Preprocedure Evaluation (Addendum)
Anesthesia Evaluation  ?Patient identified by MRN, date of birth, ID band ?Patient awake ? ? ? ?Reviewed: ?Allergy & Precautions, NPO status , Patient's Chart, lab work & pertinent test results ? ?History of Anesthesia Complications ?Negative for: history of anesthetic complications ? ?Airway ?Mallampati: II ? ?TM Distance: >3 FB ?Neck ROM: Full ? ? ? Dental ? ?(+) Poor Dentition ?  ?Pulmonary ?neg pulmonary ROS,  ?  ?Pulmonary exam normal ? ? ? ? ? ? ? Cardiovascular ?hypertension, Normal cardiovascular exam ? ? ?  ?Neuro/Psych ?negative neurological ROS ?   ? GI/Hepatic ?negative GI ROS, Neg liver ROS,   ?Endo/Other  ?diabetes, Poorly Controlled, Type 2, Oral Hypoglycemic Agents ? Renal/GU ?negative Renal ROS  ? ?  ?Musculoskeletal ?negative musculoskeletal ROS ?(+)  ? Abdominal ?  ?Peds ? Hematology ?negative hematology ROS ?(+)   ?Anesthesia Other Findings ?Squamous cell cancer left forehead scalp ? Reproductive/Obstetrics ? ?  ? ? ? ? ? ? ? ? ? ? ? ? ? ?  ?  ? ? ? ? ? ? ? ?Anesthesia Physical ?Anesthesia Plan ? ?ASA: 2 ? ?Anesthesia Plan: General  ? ?Post-op Pain Management: Tylenol PO (pre-op)*  ? ?Induction: Intravenous ? ?PONV Risk Score and Plan: 2 and Treatment may vary due to age or medical condition, Ondansetron and Midazolam ? ?Airway Management Planned: Oral ETT ? ?Additional Equipment: None ? ?Intra-op Plan:  ? ?Post-operative Plan: Extubation in OR ? ?Informed Consent: I have reviewed the patients History and Physical, chart, labs and discussed the procedure including the risks, benefits and alternatives for the proposed anesthesia with the patient or authorized representative who has indicated his/her understanding and acceptance.  ? ? ? ?Dental advisory given ? ?Plan Discussed with: CRNA ? ?Anesthesia Plan Comments:   ? ? ? ? ? ?Anesthesia Quick Evaluation ? ?

## 2022-02-07 NOTE — Op Note (Signed)
Operative Note  ? ?DATE OF OPERATION: 02/07/2022 ? ?SURGICAL DEPARTMENT: Plastic Surgery ? ?PREOPERATIVE DIAGNOSES:  left forehead squamous cell carcinoma ? ?POSTOPERATIVE DIAGNOSES:  same ? ?PROCEDURE:   ?1)  excision additional granulation tissue at site of squamous cell carcinoma, 1.8 cm ?2) burring of bone left forehead 2 cm ?3) complex closure left forehead 2 cm ? ?SURGEON: Melene Plan. Kersti Scavone, MD ? ?ASSISTANT: none ? ?ANESTHESIA:  General.  ? ?COMPLICATIONS: None.  ? ?INDICATIONS FOR PROCEDURE:  ?The patient, Patrick Mcintosh is a 66 y.o. male born on 10/14/1956, is here for treatment of left forehead squamous cell carcinoma.  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  This was emphasized many times in my discussions with the patient and discussions my staff had with the patient.  We discussed risks and benefits of the plan of excising the central area of granulation attempted closure and possible burring of bone and the patient was agreeable to proceed. ?MRN: [id number] ? ?CONSENT:  ?Informed consent was obtained directly from the patient. Risks, benefits and alternatives were fully discussed. Specific risks including but not limited to bleeding, infection, hematoma, seroma, scarring, pain, contracture, asymmetry, wound healing problems, and need for further surgery were all discussed. The patient did have an ample opportunity to have questions answered to satisfaction.  ? ?DESCRIPTION OF PROCEDURE:  ?The patient was taken to the operating room. SCDs were placed and properative antibiotics were given. Preoperative anesthesia was administered.  The patient's operative site was prepped and draped in a sterile fashion. A time out was performed and all information was confirmed to be correct.   ? ?Surgical site was injected with 1% lidocaine with epinephrine.  Following this an 11 blade was used to remove the area of  granulation tissue centrally.  This was excised all the way down to bone.  This was sent to pathology for analysis.  A Valora Corporal was used to widely undermined in a subperiosteal plane.  It was noted that the area of calvarium directly under the area excised was attenuated at the outer table.  This area was burred in its entirety using a small pineapple burr.  The wound was then closed using a pulley stitch with a 2-0 Prolene followed by interrupted 4-0 Prolene sutures.  The wound was be able to be reapproximated. ? ? ?The patient tolerated the procedure well.  There were no complications. The patient was allowed to wake from anesthesia, extubated and taken to the recovery room in satisfactory condition.   ?

## 2022-02-07 NOTE — Interval H&P Note (Signed)
History and Physical Interval Note: ? ?02/07/2022 ?8:16 AM ? ?Patrick Mcintosh  has presented today for surgery, with the diagnosis of Squamous cell cancer left forehead scalp.  The various methods of treatment have been discussed with the patient and family. After consideration of risks, benefits and other options for treatment, the patient has consented to  Procedure(s): ?SENTINEL NODE BIOPSY (Bilateral) ?ADJACENT TISSUE TRANSFER/TISSUE REARRANGEMENT (N/A) ?POSSIBLE SKIN GRAFT FULL THICKNESS TO LEFT FOREHEAD (N/A) ?APPLICATION OF SKIN SUBSTITUTE (INTEGRA) (N/A) as a surgical intervention.  The patient's history has been reviewed, patient examined, no change in status, stable for surgery.  I have reviewed the patient's chart and labs.  Questions were answered to the patient's satisfaction.   ? ? ?Lennice Sites ? ? ?

## 2022-02-07 NOTE — Op Note (Signed)
OPERATIVE REPORT ? ?DATE OF SURGERY: 02/07/2022 ? ?PATIENT:  Patrick Mcintosh,  66 y.o. male ? ?PRE-OPERATIVE DIAGNOSIS:  Squamous cell cancer left forehead scalp ? ?POST-OPERATIVE DIAGNOSIS:  Squamous cell cancer left forehead scalp ? ?PROCEDURE:  Procedure(s): ?SENTINEL NODE BIOPSY ?LEFT TEMPLE RECONSTRUCTION WITH WOUND CLOSURE ? ?SURGEON:  Beckie Salts, MD ? ?ASSISTANTS: None ? ?ANESTHESIA:   General  ? ?EBL: 30 ml ? ?DRAINS: None ? ?LOCAL MEDICATIONS USED:  None ? ?SPECIMEN: 1.  Right facial lymph node. ?2.  Left intraparotid node. ? ?COUNTS:  Correct ? ?PROCEDURE DETAILS: ?The patient was taken to the operating room and placed on the operating table in the supine position. Following induction of general endotracheal anesthesia, the initial part of the procedure was performed by plastic surgery involving the scalp lesion. ? ?Following that the right neck was prepped and draped in a standard fashion.  The neoprobe was showing high activity at the area along the mandible on the right side consistent with a facial node.  An incision was outlined with a marking pen about 2 fingerbreadths below the mandible.  Electrocautery was used to incise the skin and subcutaneous tissue and through the platysma layer.  A subplatysmal flap was developed superiorly up towards the mandible.  The facial vessels were identified as they crossed the mandible and were separately ligated and divided between clamps.  4-0 silk ties were used.  The marginal branch of the facial nerve was felt to be inferior to this.  Blunt dissection through the soft tissue identified a facial node that was approximately 1 x 2 cm and oval-shaped.  This had the highest activity over 200 counts.  This was removed and ex vivo still had high counts.  Background activity was minimal.  The wound was irrigated with saline.  Hemostasis was completed using bipolar cautery.  The defect was closed in layers using interrupted 3-0 chromic on the platysmal layer and a running  subcuticular 3-0 chromic.  Dermabond was used on the skin. ? ?Additional activity was identified along the left parotid gland.  The left side of the face was prepped and draped separately.  An incision was outlined with a marking pen around the ear lobule and a parotidectomy fashion but limited to about 2 cm.  Electrocautery was used to incise the skin.  The great auricular nerve was felt to be just inferior to this plane.  Dissection down to the parotid fascia was accomplished and the highest activity was identified within the parotid tissue.  In this region of the parotid the facial nerve was felt to be much deeper.  Careful dissection using bipolar cautery was used to remove a portion of the gland that contained the highest activity.  The activity was in the 50s.  This continued ex vivo.  The specimen was sent for pathologic evaluation.  Background signal was then below 20.  The wound was irrigated with saline.  Bipolar cautery was used for hemostasis.  The wound was closed in layers using interrupted 3-0 chromic to reapproximate the parotid tissue and then a running subcuticular closure of 3-0 chromic.  Dermabond was used on the skin.  Patient was then awakened extubated and transferred to recovery in stable condition. ? ? ? ?PATIENT DISPOSITION:  To PACU, stable ? ? ? ?

## 2022-02-07 NOTE — Anesthesia Procedure Notes (Signed)
Procedure Name: Intubation ?Date/Time: 02/07/2022 9:18 AM ?Performed by: Tawni Millers, CRNA ?Pre-anesthesia Checklist: Patient identified, Emergency Drugs available, Suction available and Patient being monitored ?Patient Re-evaluated:Patient Re-evaluated prior to induction ?Oxygen Delivery Method: Circle system utilized ?Preoxygenation: Pre-oxygenation with 100% oxygen ?Induction Type: IV induction ?Ventilation: Mask ventilation without difficulty ?Laryngoscope Size: Mac and 3 ?Tube type: Oral ?Tube size: 7.5 mm ?Number of attempts: 1 ?Airway Equipment and Method: Stylet and Oral airway ?Placement Confirmation: ETT inserted through vocal cords under direct vision, positive ETCO2 and breath sounds checked- equal and bilateral ?Tube secured with: Tape ?Dental Injury: Teeth and Oropharynx as per pre-operative assessment  ? ? ? ? ?

## 2022-02-07 NOTE — Transfer of Care (Signed)
Immediate Anesthesia Transfer of Care Note ? ?Patient: Patrick Mcintosh ? ?Procedure(s) Performed: SENTINEL NODE BIOPSY (Bilateral: Neck) ?LEFT TEMPLE RECONSTRUCTION WITH WOUND CLOSURE (Left: Head) ? ?Patient Location: PACU ? ?Anesthesia Type:General ? ?Level of Consciousness: awake ? ?Airway & Oxygen Therapy: Patient Spontanous Breathing and Patient connected to face mask oxygen ? ?Post-op Assessment: Report given to RN and Post -op Vital signs reviewed and stable ? ?Post vital signs: Reviewed and stable ? ?Last Vitals:  ?Vitals Value Taken Time  ?BP    ?Temp    ?Pulse 94 02/07/22 1056  ?Resp 10 02/07/22 1056  ?SpO2 99 % 02/07/22 1056  ?Vitals shown include unvalidated device data. ? ?Last Pain:  ?Vitals:  ? 02/07/22 0743  ?TempSrc: Oral  ?PainSc: 0-No pain  ?   ? ?Patients Stated Pain Goal: 7 (02/07/22 0743) ? ?Complications: No notable events documented. ?

## 2022-02-07 NOTE — Interval H&P Note (Signed)
History and Physical Interval Note: ? ?02/07/2022 ?9:05 AM ? ?Patrick Mcintosh  has presented today for surgery, with the diagnosis of Squamous cell cancer left forehead scalp.  The various methods of treatment have been discussed with the patient and family. After consideration of risks, benefits and other options for treatment, the patient has consented to  Procedure(s): ?SENTINEL NODE BIOPSY (Bilateral) ?ADJACENT TISSUE TRANSFER/TISSUE REARRANGEMENT (N/A) ?POSSIBLE SKIN GRAFT FULL THICKNESS TO LEFT FOREHEAD (N/A) ?APPLICATION OF SKIN SUBSTITUTE (INTEGRA) (N/A) as a surgical intervention.  The patient's history has been reviewed, patient examined, no change in status, stable for surgery.  I have reviewed the patient's chart and labs.  Questions were answered to the patient's satisfaction.   ? ? ?Izora Gala ? ? ?

## 2022-02-07 NOTE — Discharge Instructions (Addendum)
Right neck and left face: ? ?You may get these areas wet. Do not use any creams, oils or ointment. ? ? ?Elevate head of bed, may shower, may cover wound or keep antibiotic ointment on wound ? ? ?

## 2022-02-07 NOTE — Interval H&P Note (Signed)
History and Physical Interval Note: ? ?02/07/2022 ?8:54 AM ? ?Patrick Mcintosh  has presented today for surgery, with the diagnosis of Squamous cell cancer left forehead scalp.  The various methods of treatment have been discussed with the patient and family. After consideration of risks, benefits and other options for treatment, the patient has consented to  Procedure(s): ?SENTINEL NODE BIOPSY (Bilateral) ?ADJACENT TISSUE TRANSFER/TISSUE REARRANGEMENT (N/A) ?POSSIBLE SKIN GRAFT FULL THICKNESS TO LEFT FOREHEAD (N/A) ?APPLICATION OF SKIN SUBSTITUTE (INTEGRA) (N/A) as a surgical intervention.  The patient's history has been reviewed, patient examined, no change in status, stable for surgery.  I have reviewed the patient's chart and labs.  Questions were answered to the patient's satisfaction.   ? ? ?Izora Gala ? ? ?

## 2022-02-08 ENCOUNTER — Encounter (HOSPITAL_BASED_OUTPATIENT_CLINIC_OR_DEPARTMENT_OTHER): Payer: Self-pay | Admitting: Otolaryngology

## 2022-02-08 LAB — SURGICAL PATHOLOGY

## 2022-02-09 NOTE — Discharge Summary (Signed)
Physician Discharge Summary  ?Patient ID: ?Patrick Mcintosh ?MRN: [id number] ?DOB/AGE: 1956/10/15 66 y.o. ? ?Admit date: 02/07/2022 ?Discharge date: 02/09/2022 ? ?Admission Diagnoses: ? ?Discharge Diagnoses:  ?Principal Problem: ?  Squamous cell carcinoma, face ? ? ?Discharged Condition: good ? ?Hospital Course: Patient underwent left forehead excision of open area and closure.  He recovered overnight uneventfully and was discharged the next day. ? ?Consults: None -ENT was also involved in the patient's care.  Dr. Constance Holster perform sentinel node biopsy. ? ?Significant Diagnostic Studies: None ? ?Treatments: surgery: Excision of granulation tissue and closure by plastics and sentinel node biopsy by ENT on April 3. ? ?Discharge Exam: ?Blood pressure 121/79, pulse 80, temperature 98.2 ?F (36.8 ?C), resp. rate 16, height '5\' 11"'$  (1.803 m), weight 84.1 kg, SpO2 98 %. ?Head: Normocephalic, without obvious abnormality, atraumatic, incision intact no necrosis. ? ?Disposition: Discharge disposition: 01-Home or Self Care ? ? ? ? ? ? ?Discharge Instructions   ? ? Call MD for:  difficulty breathing, headache or visual disturbances   Complete by: As directed ?  ? Call MD for:  extreme fatigue   Complete by: As directed ?  ? Call MD for:  hives   Complete by: As directed ?  ? Call MD for:  persistant dizziness or light-headedness   Complete by: As directed ?  ? Call MD for:  persistant nausea and vomiting   Complete by: As directed ?  ? Call MD for:  redness, tenderness, or signs of infection (pain, swelling, redness, odor or green/yellow discharge around incision site)   Complete by: As directed ?  ? Call MD for:  severe uncontrolled pain   Complete by: As directed ?  ? Call MD for:  temperature >100.4   Complete by: As directed ?  ? Diet - low sodium heart healthy   Complete by: As directed ?  ? Increase activity slowly   Complete by: As directed ?  ? ?  ? ?Allergies as of 02/08/2022   ?No Known Allergies ?  ? ?  ?Medication List  ?  ? ?TAKE  these medications   ? ?atorvastatin 10 MG tablet ?Commonly known as: LIPITOR ?Take 10 mg by mouth daily. ?  ?metFORMIN 1000 MG tablet ?Commonly known as: GLUCOPHAGE ?Take 1,000 mg by mouth 2 (two) times daily with a meal. ?  ? ?  ? ? ?Dr. Lennice Sites ?[address] ?[place], [place] [address] ? ?Signed: ?Lennice Sites ?02/09/2022, 11:30 AM ?  ?

## 2022-02-09 NOTE — Anesthesia Postprocedure Evaluation (Signed)
Anesthesia Post Note ? ?Patient: Patrick Mcintosh ? ?Procedure(s) Performed: SENTINEL NODE BIOPSY (Bilateral: Neck) ?LEFT TEMPLE RECONSTRUCTION WITH WOUND CLOSURE (Left: Head) ? ?  ? ?Patient location during evaluation: PACU ?Anesthesia Type: General ?Level of consciousness: awake and alert ?Pain management: pain level controlled ?Vital Signs Assessment: post-procedure vital signs reviewed and stable ?Respiratory status: spontaneous breathing, nonlabored ventilation and respiratory function stable ?Cardiovascular status: blood pressure returned to baseline ?Postop Assessment: no apparent nausea or vomiting ?Anesthetic complications: no ? ? ?No notable events documented. ? ?  ?  ?  ?  ?  ? ?Marthenia Rolling ? ? ? ? ?

## 2022-02-14 ENCOUNTER — Ambulatory Visit (INDEPENDENT_AMBULATORY_CARE_PROVIDER_SITE_OTHER): Payer: BC Managed Care – PPO | Admitting: Plastic Surgery

## 2022-02-14 DIAGNOSIS — C44320 Squamous cell carcinoma of skin of unspecified parts of face: Secondary | ICD-10-CM

## 2022-02-14 NOTE — Progress Notes (Signed)
Status post left forehead excision and closure ? ?Physical exam ?Incision intact small area of eschar, millimeters along incision edge ? ?Pathology: Granulation tissue ? ?Assessment and plan ?Doing well, will see back soon, will need to consider radiation. ?

## 2022-02-28 ENCOUNTER — Ambulatory Visit (INDEPENDENT_AMBULATORY_CARE_PROVIDER_SITE_OTHER): Payer: BC Managed Care – PPO | Admitting: Plastic Surgery

## 2022-02-28 DIAGNOSIS — C44320 Squamous cell carcinoma of skin of unspecified parts of face: Secondary | ICD-10-CM

## 2022-03-02 ENCOUNTER — Telehealth: Payer: Self-pay | Admitting: Radiation Oncology

## 2022-03-02 NOTE — Progress Notes (Signed)
S/p left forehead closure on 02/07/22.  Doing well.  Has seen Dr. Constance Holster. ? ?PE ?Incision c/d/I ?Neck incisions healed ? ?A/P ?S/p excision of left temple squamous cell carcinoma.  He is now healed.  Previously I had discussed consideration of radiation with Dr. Winifred Olive and I will refer him to Dr. Isidore Moos. ?

## 2022-03-02 NOTE — Telephone Encounter (Signed)
Called patient to sch a consultation w. Dr Isidore Moos. No answer, LVM for a return call.  ?

## 2022-03-03 ENCOUNTER — Telehealth: Payer: Self-pay | Admitting: Radiation Oncology

## 2022-03-03 NOTE — Telephone Encounter (Signed)
Patient refused consultation at this time.  ?

## 2022-04-11 ENCOUNTER — Ambulatory Visit (INDEPENDENT_AMBULATORY_CARE_PROVIDER_SITE_OTHER): Payer: PPO | Admitting: Plastic Surgery

## 2022-04-11 DIAGNOSIS — C44320 Squamous cell carcinoma of skin of unspecified parts of face: Secondary | ICD-10-CM

## 2022-04-11 DIAGNOSIS — C44329 Squamous cell carcinoma of skin of other parts of face: Secondary | ICD-10-CM | POA: Diagnosis not present

## 2022-04-12 NOTE — Progress Notes (Signed)
Status post excision of left forehead carcinoma.  Patient is healing well and would like referral to radiation oncology.  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  Physical exam Left forehead well-healed with satisfactory contour  Assessment and plan Patient is healed well without signs of recurrence.  He is interested in referral to radiation oncology.  A referral was placed.

## 2022-04-13 ENCOUNTER — Telehealth: Payer: Self-pay | Admitting: Plastic Surgery

## 2022-04-25 NOTE — Progress Notes (Signed)
Oncology Nurse Navigator Documentation   I called Patrick Mcintosh to introduce myself as his nurse navigator here at [facility]. I left a voice mail with my direct number to call if he had any questions ahead of his consult with Dr. Isidore Moos tomorrow. I did make him aware in the message that I plan to introduce myself in person tomorrow as well. He knows to call me with any questions as his treatment progresses at the [facility].  Harlow Asa RN, BSN, OCN Head & Neck Oncology Nurse [place] at [facility] Phone # [phone]  Fax # [phone]

## 2022-04-25 NOTE — Progress Notes (Signed)
Histology and Location of Primary Skin Cancer: Carcinoma of left forehead   Patrick Mcintosh presented with the following signs/symptoms: (from 10/11/21 office visit with Dr. Karin Golden)   Biopsies revelaed 02/07/2022 FINAL MICROSCOPIC DIAGNOSIS:  A. SOFT TISSUE, LEFT FOREHEAD WOUND, DEBRIDEMENT:  - Benign ulcer with polypoid granulation tissue.  - Carcinoma is not identified in the specimen.  B. LYMPH NODE, RIGHT FACIAL, SENTINEL, EXCISION:  - Follicular hyperplasia of a lymph node which is negative for metastatic carcinoma.  C. LYMPH NODE, LEFT PAROTID, SENTINEL, EXCISION:  - Intraparotid lymph node without microscopic abnormalities.  - The parotid gland is normal.   Past/Anticipated interventions by patient's surgeon/dermatologist for current problematic lesion, if any:  04/11/2022 --Dr. Lennice Sites (office visit)  Status post excision of left forehead carcinoma.  Patient is healing well and would like referral to radiation oncology.   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  02/07/2022 --Dr. Izora Gala SENTINEL NODE BIOPSY LEFT TEMPLE RECONSTRUCTION WITH WOUND CLOSURE --Dr. Lennice Sites excision additional granulation tissue at site of squamous cell carcinoma, 1.8 cm burring of bone left forehead 2 cm complex closure left forehead 2 cm  11/22/2021 --Dr. Karin Golden Mohs to left forehead  Past skin cancers, if any: N/A  History of Blistering sunburns, if any: Patient denies  SAFETY ISSUES: Prior radiation? No Pacemaker/ICD? No Possible current pregnancy? N/A Is the patient on methotrexate? No  Current Complaints / other details:  Nothing else of note

## 2022-04-25 NOTE — Progress Notes (Signed)
Radiation Oncology         [phone] ________________________________  Initial Outpatient Consultation  Name: Patrick Mcintosh MRN: [id number]  Date: 04/26/2022  DOB: 1956-04-15  QI:ONGEX, Dorothea Ogle, NP  Lennice Sites, MD   REFERRING PHYSICIAN: Lennice Sites, MD  DIAGNOSIS: No diagnosis found.  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  CHIEF COMPLAINT: Here to discuss management of skin cancer  HISTORY OF PRESENT ILLNESS::Patrick Mcintosh is a 66 y.o. male who presents today for consideration of radiation therapy in management of a site of SCC on his left forehead. Per record review, the patient's forehead lesion was initially biopsied on 08/31/21 which revealed SCC.    The patient underwent Mohs surgery to the forehead lesion by Dr. Winifred Olive on 11/22/21 which revealed SCC. The patient also met with Dr. Erin Hearing (plastic surgery) on this date to discuss reconstructive surgery to the left forehead area. Per encounter notes, Dr. Winifred Olive additionally suggested proceeding with SLN biopsies.   Accordingly, the patient was referred to Dr. Constance Holster on 12/31/21 for consideration of SLN biopsies. Physical exam performed during this visit revealed no palpable lymphadenopathy, but the patient was cleared to proceed with SLN biopsies.   The patient proceeded to undergo reconstructive surgery with Integra graft, focal bone margin burring, and SLN biopsies on 02/07/22 under the care of Dr. Erin Hearing. Pathology revealed no evidence of carcinoma from debridement of the left forehead lesion. Nodal status of 1 right facial and 1 left parotid sentinel lymph node negative for carcinoma. Right facial SLN also showed follicular hyperplasia. 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   The patient was noted to have no post-op complications during  subsequent follow-up visits with Dr. Constance Holster and Dr. Erin Hearing.    Post-Mohs surgery taken on 11/22/21 - Dr. Winifred Olive:    Pre-reconstructive surgery photo taken on 02/07/22:    Post-op follow-up photo taken on 04/11/22 - Dr. Erin Hearing:      PREVIOUS RADIATION THERAPY: No  PAST MEDICAL HISTORY:  has a past medical history of Glaucoma, Hyperlipidemia, Labile hypertension, Type II or unspecified type diabetes mellitus without mention of complication, not stated as uncontrolled, and Vitamin D deficiency.    PAST SURGICAL HISTORY: Past Surgical History:  Procedure Laterality Date   ADJACENT TISSUE TRANSFER/TISSUE REARRANGEMENT Left 02/07/2022   Procedure: LEFT TEMPLE RECONSTRUCTION WITH WOUND CLOSURE;  Surgeon: Lennice Sites, MD;  Location: [facility];  Service: Plastics;  Laterality: Left;   HERNIA REPAIR  2004   LYMPH NODE BIOPSY Bilateral 02/07/2022   Procedure: SENTINEL NODE BIOPSY;  Surgeon: Izora Gala, MD;  Location: [facility];  Service: ENT;  Laterality: Bilateral;   STRABISMUS SURGERY  1958    FAMILY HISTORY: family history includes Pneumonia in his father.  SOCIAL HISTORY:  reports that he has never smoked. He has never used smokeless tobacco. He reports current alcohol use. He reports that he does not use drugs.  ALLERGIES: Patient has no known allergies.  MEDICATIONS:  Current Outpatient Medications  Medication Sig Dispense Refill   atorvastatin (LIPITOR) 10 MG tablet Take 10 mg by mouth daily.     metFORMIN (GLUCOPHAGE) 1000 MG tablet Take 1,000 mg by mouth 2 (two) times daily with a meal.     No current facility-administered medications for this encounter.    REVIEW OF SYSTEMS:  Notable for that above.   PHYSICAL EXAM:  vitals were not taken for this visit.   General: Alert and oriented, in no acute distress *** HEENT: Head is normocephalic. Extraocular movements are intact. Oropharynx is clear. Neck: Neck is supple, no palpable  cervical or supraclavicular lymphadenopathy. Heart: Regular in rate and rhythm with no murmurs, rubs, or gallops. Chest: Clear to auscultation bilaterally, with no rhonchi, wheezes, or rales. Abdomen: Soft, nontender, nondistended, with no rigidity or guarding. Extremities: No cyanosis or edema. Lymphatics: see Neck Exam Skin: No concerning lesions. Musculoskeletal: symmetric strength and muscle tone throughout. Neurologic: Cranial nerves II through XII are grossly intact. No obvious focalities. Speech is fluent. Coordination is intact. Psychiatric: Judgment and insight are intact. Affect is appropriate.   ECOG = ***  0 - Asymptomatic (Fully active, able to carry on all predisease activities without restriction)  1 - Symptomatic but completely ambulatory (Restricted in physically strenuous activity but ambulatory and able to carry out work of a light or sedentary nature. For example, light housework, office work)  2 - Symptomatic, <50% in bed during the day (Ambulatory and capable of all self care but unable to carry out any work activities. Up and about more than 50% of waking hours)  3 - Symptomatic, >50% in bed, but not bedbound (Capable of only limited self-care, confined to bed or chair 50% or more of waking hours)  4 - Bedbound (Completely disabled. Cannot carry on any self-care. Totally confined to bed or chair)  5 - Death   Eustace Pen MM, Creech RH, Tormey DC, et al. [phone]). "Toxicity and response criteria of the [facility]". [place] Oncol. 5 (6): 649-55   LABORATORY DATA:  Lab Results  Component Value Date   WBC 9.7 06/07/2016   HGB 15.8 06/07/2016   HCT 44.0 06/07/2016   MCV 91.3 06/07/2016   PLT 254 06/07/2016   CMP     Component Value Date/Time   NA 135 01/31/2022 1210   K 5.4 (H) 01/31/2022 1210   CL 103 01/31/2022 1210   CO2 26 01/31/2022 1210   GLUCOSE 265 (H) 01/31/2022 1210   BUN 16 01/31/2022 1210   CREATININE 0.93 01/31/2022  1210   CALCIUM 9.7 01/31/2022 1210   GFRNONAA >60 01/31/2022 1210   GFRAA >60 06/07/2016 1044         RADIOGRAPHY: No results found.    IMPRESSION/PLAN:***    On date of service, in total, I spent *** minutes on this encounter. Patient was seen in person.   __________________________________________   Eppie Gibson, MD  This document serves as a record of services personally performed by Eppie Gibson, MD. It was created on her behalf by Roney Mans, a trained medical scribe. The creation of this record is based on the scribe's personal observations and the provider's statements to them. This document has been checked and approved by the attending provider.

## 2022-04-26 ENCOUNTER — Ambulatory Visit
Admission: RE | Admit: 2022-04-26 | Discharge: 2022-04-26 | Disposition: A | Discharge status: Home / Self Care | Payer: PPO | Source: Ambulatory Visit | Attending: Radiation Oncology | Admitting: Radiation Oncology

## 2022-04-26 ENCOUNTER — Other Ambulatory Visit: Payer: Self-pay

## 2022-04-26 ENCOUNTER — Encounter: Payer: Self-pay | Admitting: Radiation Oncology

## 2022-04-26 VITALS — BP 143/90 | HR 100 | Temp 98.3°F | Resp 20 | Wt 183.4 lb

## 2022-04-26 DIAGNOSIS — E785 Hyperlipidemia, unspecified: Secondary | ICD-10-CM | POA: Insufficient documentation

## 2022-04-26 DIAGNOSIS — E119 Type 2 diabetes mellitus without complications: Secondary | ICD-10-CM | POA: Insufficient documentation

## 2022-04-26 DIAGNOSIS — C44320 Squamous cell carcinoma of skin of unspecified parts of face: Secondary | ICD-10-CM

## 2022-04-26 DIAGNOSIS — I10 Essential (primary) hypertension: Secondary | ICD-10-CM | POA: Insufficient documentation

## 2022-04-26 DIAGNOSIS — Z7984 Long term (current) use of oral hypoglycemic drugs: Secondary | ICD-10-CM | POA: Diagnosis not present

## 2022-04-26 DIAGNOSIS — E559 Vitamin D deficiency, unspecified: Secondary | ICD-10-CM | POA: Diagnosis not present

## 2022-04-26 DIAGNOSIS — C44329 Squamous cell carcinoma of skin of other parts of face: Secondary | ICD-10-CM | POA: Diagnosis not present

## 2022-04-26 NOTE — Progress Notes (Signed)
Oncology Nurse Navigator Documentation   Met with patient during initial consult with Dr. Isidore Moos. I introduced myself as his Navigator, explained my role as a member of the Care Team.   He verbalized understanding of information provided. I encouraged him to call with questions/concerns moving forward.  Harlow Asa, RN, BSN, OCN Head & Neck Oncology Nurse [place] at [place] [phone]

## 2022-04-27 ENCOUNTER — Ambulatory Visit
Admission: RE | Admit: 2022-04-27 | Discharge: 2022-04-27 | Disposition: A | Discharge status: Home / Self Care | Payer: PPO | Source: Ambulatory Visit | Attending: Radiation Oncology | Admitting: Radiation Oncology

## 2022-04-27 DIAGNOSIS — Z510 Encounter for antineoplastic radiation therapy: Secondary | ICD-10-CM | POA: Diagnosis not present

## 2022-04-27 DIAGNOSIS — C44329 Squamous cell carcinoma of skin of other parts of face: Secondary | ICD-10-CM | POA: Insufficient documentation

## 2022-04-29 DIAGNOSIS — Z510 Encounter for antineoplastic radiation therapy: Secondary | ICD-10-CM | POA: Diagnosis not present

## 2022-05-04 ENCOUNTER — Other Ambulatory Visit: Payer: Self-pay

## 2022-05-04 ENCOUNTER — Ambulatory Visit
Admission: RE | Admit: 2022-05-04 | Discharge: 2022-05-04 | Disposition: A | Discharge status: Home / Self Care | Payer: PPO | Source: Ambulatory Visit | Attending: Radiation Oncology | Admitting: Radiation Oncology

## 2022-05-04 DIAGNOSIS — Z510 Encounter for antineoplastic radiation therapy: Secondary | ICD-10-CM | POA: Diagnosis not present

## 2022-05-04 LAB — RAD ONC ARIA SESSION SUMMARY
Course Elapsed Days: 0
Plan Fractions Treated to Date: 1
Plan Prescribed Dose Per Fraction: 2.5 Gy
Plan Total Fractions Prescribed: 20
Plan Total Prescribed Dose: 50 Gy
Reference Point Dosage Given to Date: 2.5 Gy
Reference Point Session Dosage Given: 2.5 Gy
Session Number: 1

## 2022-05-05 ENCOUNTER — Other Ambulatory Visit: Payer: Self-pay

## 2022-05-05 ENCOUNTER — Ambulatory Visit
Admission: RE | Admit: 2022-05-05 | Discharge: 2022-05-05 | Disposition: A | Discharge status: Home / Self Care | Payer: PPO | Source: Ambulatory Visit | Attending: Radiation Oncology | Admitting: Radiation Oncology

## 2022-05-05 DIAGNOSIS — Z510 Encounter for antineoplastic radiation therapy: Secondary | ICD-10-CM | POA: Diagnosis not present

## 2022-05-05 LAB — RAD ONC ARIA SESSION SUMMARY
Course Elapsed Days: 1
Plan Fractions Treated to Date: 2
Plan Prescribed Dose Per Fraction: 2.5 Gy
Plan Total Fractions Prescribed: 20
Plan Total Prescribed Dose: 50 Gy
Reference Point Dosage Given to Date: 5 Gy
Reference Point Session Dosage Given: 2.5 Gy
Session Number: 2

## 2022-05-06 ENCOUNTER — Other Ambulatory Visit: Payer: Self-pay

## 2022-05-06 ENCOUNTER — Ambulatory Visit
Admission: RE | Admit: 2022-05-06 | Discharge: 2022-05-06 | Disposition: A | Discharge status: Home / Self Care | Payer: PPO | Source: Ambulatory Visit | Attending: Radiation Oncology | Admitting: Radiation Oncology

## 2022-05-06 DIAGNOSIS — Z510 Encounter for antineoplastic radiation therapy: Secondary | ICD-10-CM | POA: Diagnosis not present

## 2022-05-06 LAB — RAD ONC ARIA SESSION SUMMARY
Course Elapsed Days: 2
Plan Fractions Treated to Date: 3
Plan Prescribed Dose Per Fraction: 2.5 Gy
Plan Total Fractions Prescribed: 20
Plan Total Prescribed Dose: 50 Gy
Reference Point Dosage Given to Date: 7.5 Gy
Reference Point Session Dosage Given: 2.5 Gy
Session Number: 3

## 2022-05-09 ENCOUNTER — Other Ambulatory Visit: Payer: Self-pay

## 2022-05-09 ENCOUNTER — Ambulatory Visit
Admission: RE | Admit: 2022-05-09 | Discharge: 2022-05-09 | Disposition: A | Discharge status: Home / Self Care | Payer: PPO | Source: Ambulatory Visit | Attending: Radiation Oncology | Admitting: Radiation Oncology

## 2022-05-09 ENCOUNTER — Ambulatory Visit: Payer: PPO

## 2022-05-09 DIAGNOSIS — Z510 Encounter for antineoplastic radiation therapy: Secondary | ICD-10-CM | POA: Diagnosis not present

## 2022-05-09 DIAGNOSIS — C44320 Squamous cell carcinoma of skin of unspecified parts of face: Secondary | ICD-10-CM | POA: Insufficient documentation

## 2022-05-09 LAB — RAD ONC ARIA SESSION SUMMARY
Course Elapsed Days: 5
Plan Fractions Treated to Date: 4
Plan Prescribed Dose Per Fraction: 2.5 Gy
Plan Total Fractions Prescribed: 20
Plan Total Prescribed Dose: 50 Gy
Reference Point Dosage Given to Date: 10 Gy
Reference Point Session Dosage Given: 2.5 Gy
Session Number: 4

## 2022-05-09 MED ORDER — SONAFINE EX EMUL
1.0000 | Freq: Two times a day (BID) | CUTANEOUS | Status: DC
  Administered 2022-05-09: 1 via TOPICAL

## 2022-05-09 NOTE — Progress Notes (Signed)
Pt here for patient teaching.    Pt given Radiation and You booklet, skin care instructions, and Sonafine.    Reviewed areas of pertinence such as fatigue, hair loss in treatment field, and skin changes .   Pt able to give teach back of to pat skin, use unscented/gentle soap, and drink plenty of water,apply Sonafine bid and avoid applying anything to skin within 4 hours of treatment.   Pt demonstrated understanding and verbalizes understanding of information given and will contact nursing with any questions or concerns.    [email or web address]

## 2022-05-11 ENCOUNTER — Other Ambulatory Visit: Payer: Self-pay

## 2022-05-11 ENCOUNTER — Ambulatory Visit
Admission: RE | Admit: 2022-05-11 | Discharge: 2022-05-11 | Disposition: A | Discharge status: Home / Self Care | Payer: PPO | Source: Ambulatory Visit | Attending: Radiation Oncology | Admitting: Radiation Oncology

## 2022-05-11 DIAGNOSIS — Z510 Encounter for antineoplastic radiation therapy: Secondary | ICD-10-CM | POA: Diagnosis not present

## 2022-05-11 LAB — RAD ONC ARIA SESSION SUMMARY
Course Elapsed Days: 7
Plan Fractions Treated to Date: 5
Plan Prescribed Dose Per Fraction: 2.5 Gy
Plan Total Fractions Prescribed: 20
Plan Total Prescribed Dose: 50 Gy
Reference Point Dosage Given to Date: 12.5 Gy
Reference Point Session Dosage Given: 2.5 Gy
Session Number: 5

## 2022-05-11 NOTE — Telephone Encounter (Signed)
Error/ltd ° °

## 2022-05-12 ENCOUNTER — Other Ambulatory Visit: Payer: Self-pay

## 2022-05-12 ENCOUNTER — Ambulatory Visit
Admission: RE | Admit: 2022-05-12 | Discharge: 2022-05-12 | Disposition: A | Discharge status: Home / Self Care | Payer: PPO | Source: Ambulatory Visit | Attending: Radiation Oncology | Admitting: Radiation Oncology

## 2022-05-12 DIAGNOSIS — Z510 Encounter for antineoplastic radiation therapy: Secondary | ICD-10-CM | POA: Diagnosis not present

## 2022-05-12 LAB — RAD ONC ARIA SESSION SUMMARY
Course Elapsed Days: 8
Plan Fractions Treated to Date: 6
Plan Prescribed Dose Per Fraction: 2.5 Gy
Plan Total Fractions Prescribed: 20
Plan Total Prescribed Dose: 50 Gy
Reference Point Dosage Given to Date: 15 Gy
Reference Point Session Dosage Given: 2.5 Gy
Session Number: 6

## 2022-05-13 ENCOUNTER — Ambulatory Visit
Admission: RE | Admit: 2022-05-13 | Discharge: 2022-05-13 | Disposition: A | Discharge status: Home / Self Care | Payer: PPO | Source: Ambulatory Visit | Attending: Radiation Oncology | Admitting: Radiation Oncology

## 2022-05-13 ENCOUNTER — Other Ambulatory Visit: Payer: Self-pay

## 2022-05-13 DIAGNOSIS — Z510 Encounter for antineoplastic radiation therapy: Secondary | ICD-10-CM | POA: Diagnosis not present

## 2022-05-13 LAB — RAD ONC ARIA SESSION SUMMARY
Course Elapsed Days: 9
Plan Fractions Treated to Date: 7
Plan Prescribed Dose Per Fraction: 2.5 Gy
Plan Total Fractions Prescribed: 20
Plan Total Prescribed Dose: 50 Gy
Reference Point Dosage Given to Date: 17.5 Gy
Reference Point Session Dosage Given: 2.5 Gy
Session Number: 7

## 2022-05-16 ENCOUNTER — Ambulatory Visit: Payer: PPO

## 2022-05-17 ENCOUNTER — Ambulatory Visit
Admission: RE | Admit: 2022-05-17 | Discharge: 2022-05-17 | Disposition: A | Discharge status: Home / Self Care | Payer: PPO | Source: Ambulatory Visit | Attending: Radiation Oncology | Admitting: Radiation Oncology

## 2022-05-17 ENCOUNTER — Other Ambulatory Visit: Payer: Self-pay

## 2022-05-17 DIAGNOSIS — Z510 Encounter for antineoplastic radiation therapy: Secondary | ICD-10-CM | POA: Diagnosis not present

## 2022-05-17 LAB — RAD ONC ARIA SESSION SUMMARY
Course Elapsed Days: 13
Plan Fractions Treated to Date: 8
Plan Prescribed Dose Per Fraction: 2.5 Gy
Plan Total Fractions Prescribed: 20
Plan Total Prescribed Dose: 50 Gy
Reference Point Dosage Given to Date: 20 Gy
Reference Point Session Dosage Given: 2.5 Gy
Session Number: 8

## 2022-05-18 ENCOUNTER — Ambulatory Visit
Admission: RE | Admit: 2022-05-18 | Discharge: 2022-05-18 | Disposition: A | Discharge status: Home / Self Care | Payer: PPO | Source: Ambulatory Visit | Attending: Radiation Oncology | Admitting: Radiation Oncology

## 2022-05-18 ENCOUNTER — Other Ambulatory Visit: Payer: Self-pay

## 2022-05-18 DIAGNOSIS — Z510 Encounter for antineoplastic radiation therapy: Secondary | ICD-10-CM | POA: Diagnosis not present

## 2022-05-18 LAB — RAD ONC ARIA SESSION SUMMARY
Course Elapsed Days: 14
Plan Fractions Treated to Date: 9
Plan Prescribed Dose Per Fraction: 2.5 Gy
Plan Total Fractions Prescribed: 20
Plan Total Prescribed Dose: 50 Gy
Reference Point Dosage Given to Date: 22.5 Gy
Reference Point Session Dosage Given: 2.5 Gy
Session Number: 9

## 2022-05-19 ENCOUNTER — Other Ambulatory Visit: Payer: Self-pay

## 2022-05-19 ENCOUNTER — Ambulatory Visit
Admission: RE | Admit: 2022-05-19 | Discharge: 2022-05-19 | Disposition: A | Discharge status: Home / Self Care | Payer: PPO | Source: Ambulatory Visit | Attending: Radiation Oncology | Admitting: Radiation Oncology

## 2022-05-19 DIAGNOSIS — Z510 Encounter for antineoplastic radiation therapy: Secondary | ICD-10-CM | POA: Diagnosis not present

## 2022-05-19 LAB — RAD ONC ARIA SESSION SUMMARY
Course Elapsed Days: 15
Plan Fractions Treated to Date: 10
Plan Prescribed Dose Per Fraction: 2.5 Gy
Plan Total Fractions Prescribed: 20
Plan Total Prescribed Dose: 50 Gy
Reference Point Dosage Given to Date: 25 Gy
Reference Point Session Dosage Given: 2.5 Gy
Session Number: 10

## 2022-05-20 ENCOUNTER — Other Ambulatory Visit: Payer: Self-pay

## 2022-05-20 ENCOUNTER — Ambulatory Visit
Admission: RE | Admit: 2022-05-20 | Discharge: 2022-05-20 | Disposition: A | Discharge status: Home / Self Care | Payer: PPO | Source: Ambulatory Visit | Attending: Radiation Oncology | Admitting: Radiation Oncology

## 2022-05-20 DIAGNOSIS — Z510 Encounter for antineoplastic radiation therapy: Secondary | ICD-10-CM | POA: Diagnosis not present

## 2022-05-20 LAB — RAD ONC ARIA SESSION SUMMARY
Course Elapsed Days: 16
Plan Fractions Treated to Date: 11
Plan Prescribed Dose Per Fraction: 2.5 Gy
Plan Total Fractions Prescribed: 20
Plan Total Prescribed Dose: 50 Gy
Reference Point Dosage Given to Date: 27.5 Gy
Reference Point Session Dosage Given: 2.5 Gy
Session Number: 11

## 2022-05-23 ENCOUNTER — Other Ambulatory Visit: Payer: Self-pay

## 2022-05-23 ENCOUNTER — Ambulatory Visit
Admission: RE | Admit: 2022-05-23 | Discharge: 2022-05-23 | Disposition: A | Discharge status: Home / Self Care | Payer: PPO | Source: Ambulatory Visit | Attending: Radiation Oncology | Admitting: Radiation Oncology

## 2022-05-23 DIAGNOSIS — Z510 Encounter for antineoplastic radiation therapy: Secondary | ICD-10-CM | POA: Diagnosis not present

## 2022-05-23 LAB — RAD ONC ARIA SESSION SUMMARY
Course Elapsed Days: 19
Plan Fractions Treated to Date: 12
Plan Prescribed Dose Per Fraction: 2.5 Gy
Plan Total Fractions Prescribed: 20
Plan Total Prescribed Dose: 50 Gy
Reference Point Dosage Given to Date: 30 Gy
Reference Point Session Dosage Given: 2.5 Gy
Session Number: 12

## 2022-05-24 ENCOUNTER — Ambulatory Visit
Admission: RE | Admit: 2022-05-24 | Discharge: 2022-05-24 | Disposition: A | Discharge status: Home / Self Care | Payer: PPO | Source: Ambulatory Visit | Attending: Radiation Oncology | Admitting: Radiation Oncology

## 2022-05-24 ENCOUNTER — Other Ambulatory Visit: Payer: Self-pay

## 2022-05-24 DIAGNOSIS — Z510 Encounter for antineoplastic radiation therapy: Secondary | ICD-10-CM | POA: Diagnosis not present

## 2022-05-24 LAB — RAD ONC ARIA SESSION SUMMARY
Course Elapsed Days: 20
Plan Fractions Treated to Date: 13
Plan Prescribed Dose Per Fraction: 2.5 Gy
Plan Total Fractions Prescribed: 20
Plan Total Prescribed Dose: 50 Gy
Reference Point Dosage Given to Date: 32.5 Gy
Reference Point Session Dosage Given: 2.5 Gy
Session Number: 13

## 2022-05-25 ENCOUNTER — Other Ambulatory Visit: Payer: Self-pay

## 2022-05-25 ENCOUNTER — Ambulatory Visit
Admission: RE | Admit: 2022-05-25 | Discharge: 2022-05-25 | Disposition: A | Discharge status: Home / Self Care | Payer: PPO | Source: Ambulatory Visit | Attending: Radiation Oncology | Admitting: Radiation Oncology

## 2022-05-25 DIAGNOSIS — Z510 Encounter for antineoplastic radiation therapy: Secondary | ICD-10-CM | POA: Diagnosis not present

## 2022-05-25 LAB — RAD ONC ARIA SESSION SUMMARY
Course Elapsed Days: 21
Plan Fractions Treated to Date: 14
Plan Prescribed Dose Per Fraction: 2.5 Gy
Plan Total Fractions Prescribed: 20
Plan Total Prescribed Dose: 50 Gy
Reference Point Dosage Given to Date: 35 Gy
Reference Point Session Dosage Given: 2.5 Gy
Session Number: 14

## 2022-05-26 ENCOUNTER — Telehealth: Payer: Self-pay | Admitting: *Deleted

## 2022-05-26 ENCOUNTER — Ambulatory Visit
Admission: RE | Admit: 2022-05-26 | Discharge: 2022-05-26 | Disposition: A | Discharge status: Home / Self Care | Payer: PPO | Source: Ambulatory Visit | Attending: Radiation Oncology | Admitting: Radiation Oncology

## 2022-05-26 ENCOUNTER — Other Ambulatory Visit: Payer: Self-pay

## 2022-05-26 DIAGNOSIS — Z510 Encounter for antineoplastic radiation therapy: Secondary | ICD-10-CM | POA: Diagnosis not present

## 2022-05-26 LAB — RAD ONC ARIA SESSION SUMMARY
Course Elapsed Days: 22
Plan Fractions Treated to Date: 15
Plan Prescribed Dose Per Fraction: 2.5 Gy
Plan Total Fractions Prescribed: 20
Plan Total Prescribed Dose: 50 Gy
Reference Point Dosage Given to Date: 37.5 Gy
Reference Point Session Dosage Given: 2.5 Gy
Session Number: 15

## 2022-05-26 NOTE — Telephone Encounter (Signed)
Received on (03/02/22) via of fax request for colored photo of site to be emailed to Comern[facility]o at [facility].    Faxed message back letting Arraayl at the [facility] know that the photo of the site are on epic media.//AB/CMA

## 2022-05-27 ENCOUNTER — Ambulatory Visit
Admission: RE | Admit: 2022-05-27 | Discharge: 2022-05-27 | Disposition: A | Discharge status: Home / Self Care | Payer: PPO | Source: Ambulatory Visit | Attending: Radiation Oncology | Admitting: Radiation Oncology

## 2022-05-27 ENCOUNTER — Other Ambulatory Visit: Payer: Self-pay

## 2022-05-27 DIAGNOSIS — Z510 Encounter for antineoplastic radiation therapy: Secondary | ICD-10-CM | POA: Diagnosis not present

## 2022-05-27 LAB — RAD ONC ARIA SESSION SUMMARY
Course Elapsed Days: 23
Plan Fractions Treated to Date: 16
Plan Prescribed Dose Per Fraction: 2.5 Gy
Plan Total Fractions Prescribed: 20
Plan Total Prescribed Dose: 50 Gy
Reference Point Dosage Given to Date: 40 Gy
Reference Point Session Dosage Given: 2.5 Gy
Session Number: 16

## 2022-05-30 ENCOUNTER — Other Ambulatory Visit: Payer: Self-pay

## 2022-05-30 ENCOUNTER — Ambulatory Visit
Admission: RE | Admit: 2022-05-30 | Discharge: 2022-05-30 | Disposition: A | Discharge status: Home / Self Care | Payer: PPO | Source: Ambulatory Visit | Attending: Radiation Oncology | Admitting: Radiation Oncology

## 2022-05-30 ENCOUNTER — Ambulatory Visit: Payer: PPO

## 2022-05-30 DIAGNOSIS — Z510 Encounter for antineoplastic radiation therapy: Secondary | ICD-10-CM | POA: Diagnosis not present

## 2022-05-30 LAB — RAD ONC ARIA SESSION SUMMARY
Course Elapsed Days: 26
Plan Fractions Treated to Date: 17
Plan Prescribed Dose Per Fraction: 2.5 Gy
Plan Total Fractions Prescribed: 20
Plan Total Prescribed Dose: 50 Gy
Reference Point Dosage Given to Date: 42.5 Gy
Reference Point Session Dosage Given: 2.5 Gy
Session Number: 17

## 2022-05-31 ENCOUNTER — Other Ambulatory Visit: Payer: Self-pay

## 2022-05-31 ENCOUNTER — Ambulatory Visit
Admission: RE | Admit: 2022-05-31 | Discharge: 2022-05-31 | Disposition: A | Discharge status: Home / Self Care | Payer: PPO | Source: Ambulatory Visit | Attending: Radiation Oncology | Admitting: Radiation Oncology

## 2022-05-31 DIAGNOSIS — Z510 Encounter for antineoplastic radiation therapy: Secondary | ICD-10-CM | POA: Diagnosis not present

## 2022-05-31 LAB — RAD ONC ARIA SESSION SUMMARY
Course Elapsed Days: 27
Plan Fractions Treated to Date: 18
Plan Prescribed Dose Per Fraction: 2.5 Gy
Plan Total Fractions Prescribed: 20
Plan Total Prescribed Dose: 50 Gy
Reference Point Dosage Given to Date: 45 Gy
Reference Point Session Dosage Given: 2.5 Gy
Session Number: 18

## 2022-06-01 ENCOUNTER — Ambulatory Visit: Payer: PPO

## 2022-06-01 ENCOUNTER — Ambulatory Visit
Admission: RE | Admit: 2022-06-01 | Discharge: 2022-06-01 | Disposition: A | Discharge status: Home / Self Care | Payer: PPO | Source: Ambulatory Visit | Attending: Radiation Oncology | Admitting: Radiation Oncology

## 2022-06-01 ENCOUNTER — Other Ambulatory Visit: Payer: Self-pay

## 2022-06-01 DIAGNOSIS — Z510 Encounter for antineoplastic radiation therapy: Secondary | ICD-10-CM | POA: Diagnosis not present

## 2022-06-01 LAB — RAD ONC ARIA SESSION SUMMARY
Course Elapsed Days: 28
Plan Fractions Treated to Date: 19
Plan Prescribed Dose Per Fraction: 2.5 Gy
Plan Total Fractions Prescribed: 20
Plan Total Prescribed Dose: 50 Gy
Reference Point Dosage Given to Date: 47.5 Gy
Reference Point Session Dosage Given: 2.5 Gy
Session Number: 19

## 2022-06-02 ENCOUNTER — Other Ambulatory Visit: Payer: Self-pay

## 2022-06-02 ENCOUNTER — Ambulatory Visit
Admission: RE | Admit: 2022-06-02 | Discharge: 2022-06-02 | Disposition: A | Discharge status: Home / Self Care | Payer: PPO | Source: Ambulatory Visit | Attending: Radiation Oncology | Admitting: Radiation Oncology

## 2022-06-02 ENCOUNTER — Encounter: Payer: Self-pay | Admitting: Radiation Oncology

## 2022-06-02 DIAGNOSIS — Z510 Encounter for antineoplastic radiation therapy: Secondary | ICD-10-CM | POA: Diagnosis not present

## 2022-06-02 LAB — RAD ONC ARIA SESSION SUMMARY
Course Elapsed Days: 29
Plan Fractions Treated to Date: 20
Plan Prescribed Dose Per Fraction: 2.5 Gy
Plan Total Fractions Prescribed: 20
Plan Total Prescribed Dose: 50 Gy
Reference Point Dosage Given to Date: 50 Gy
Reference Point Session Dosage Given: 2.5 Gy
Session Number: 20

## 2022-06-02 NOTE — Progress Notes (Signed)
Oncology Nurse Navigator Documentation   Mr. Patrick Mcintosh completed his radiation today without difficulty. He was provided my contact information during his consult and is aware that he can call me at anytime with questions. He is scheduled to see Dr. Isidore Moos on 07/13/22 for radiation follow up.  Harlow Asa RN, BSN, OCN Head & Neck Oncology Nurse [place] at [facility] Phone # [phone]  Fax # [phone]

## 2022-06-29 NOTE — Progress Notes (Signed)
                                                                                                                                                             Patient Name: Patrick Mcintosh MRN: [id number] DOB: 1956/08/24 Referring Physician: Lennice Sites Date of Service: 06/02/2022 [facility], [place]                                                        End Of Treatment Note  Diagnoses: C44.329-Squamous cell carcinoma of skin of other parts of face  Cancer Staging:  Cancer Staging  Squamous cell carcinoma, face Staging form: Cutaneous Carcinoma of the Head and Neck, AJCC 8th Edition - Clinical stage from 04/26/2022: Stage III (cT3, cN0, cM0) - Signed by Eppie Gibson, MD on 04/26/2022 Stage prefix: Initial diagnosis Extraosseous extension: Absent  Intent: Curative  Radiation Treatment Dates: 05/04/2022 through 06/02/2022 Site Technique Total Dose (Gy) Dose per Fx (Gy) Completed Fx Beam Energies  Face: HN_L_foreh specialPort 50/50 2.5 20/20 6E, 9E   Narrative: The patient tolerated radiation therapy relatively well.   Plan: The patient will follow-up with radiation oncology in 20mo -----------------------------------  SEppie Gibson MD

## 2022-07-08 NOTE — Progress Notes (Signed)
Patrick Mcintosh presents today for follow-up after completing radiation to his forehead on 06/02/2022  Pain issues, if any: Denies Skin: Skin appears intact and well healed. Reports he never had any blistering or peeling once he completed treatment, and was diligent about applying sonafine cream to treatment area Fatigue: Denies any lingering tiredness  Dermatology F/U: Denies any appointments later this year Other notable issues, if any: Overall reports he's doing well and pleased with his continued progress

## 2022-07-13 ENCOUNTER — Other Ambulatory Visit: Payer: Self-pay

## 2022-07-13 ENCOUNTER — Ambulatory Visit
Admission: RE | Admit: 2022-07-13 | Discharge: 2022-07-13 | Disposition: A | Discharge status: Home / Self Care | Payer: PPO | Source: Ambulatory Visit | Attending: Radiation Oncology | Admitting: Radiation Oncology

## 2022-07-13 VITALS — BP 117/82 | HR 76 | Temp 98.1°F | Resp 18 | Ht 70.0 in | Wt 172.6 lb

## 2022-07-13 DIAGNOSIS — Z923 Personal history of irradiation: Secondary | ICD-10-CM | POA: Insufficient documentation

## 2022-07-13 DIAGNOSIS — C44329 Squamous cell carcinoma of skin of other parts of face: Secondary | ICD-10-CM | POA: Insufficient documentation

## 2022-07-13 DIAGNOSIS — Z79899 Other long term (current) drug therapy: Secondary | ICD-10-CM | POA: Diagnosis not present

## 2022-07-13 DIAGNOSIS — Z7984 Long term (current) use of oral hypoglycemic drugs: Secondary | ICD-10-CM | POA: Insufficient documentation

## 2022-07-13 DIAGNOSIS — C44320 Squamous cell carcinoma of skin of unspecified parts of face: Secondary | ICD-10-CM

## 2022-07-15 ENCOUNTER — Other Ambulatory Visit: Payer: Self-pay

## 2022-07-15 DIAGNOSIS — C44329 Squamous cell carcinoma of skin of other parts of face: Secondary | ICD-10-CM

## 2022-07-18 ENCOUNTER — Encounter: Payer: Self-pay | Admitting: Radiation Oncology

## 2022-07-18 NOTE — Progress Notes (Signed)
  Radiation Oncology         [phone] ________________________________  Name: Patrick Mcintosh MRN: [id number]  Date: 07/13/2022  DOB: [birth year]  Follow-Up Visit Note  [facility]  CC: Emelia Loron, NP  Lennice Sites, MD  Diagnosis and Prior Radiotherapy:    ICD-10-CM   1. Squamous cell carcinoma of forehead  C44.329     2. Squamous cell carcinoma, face  C44.320       Radiation Treatment Dates: 05/04/2022 through 06/02/2022 Site Technique Total Dose (Gy) Dose per Fx (Gy) Completed Fx Beam Energies  Face: HN_L_foreh specialPort 50/50 2.5 20/20 6E, 9E    CHIEF COMPLAINT: Here for follow-up and surveillance of skin cancer  Narrative:  The patient returns today for routine follow-up.        Patrick Mcintosh presents today for follow-up after completing radiation to his forehead on 06/02/2022  Pain issues, if any: Denies Skin: Skin appears intact and well healed. Reports he never had any blistering or peeling once he completed treatment, and was diligent about applying sonafine cream to treatment area Fatigue: Denies any lingering tiredness  Dermatology F/U: Denies any appointments later this year Other notable issues, if any: Overall reports he's doing well and pleased with his continued progress                          ALLERGIES:  has No Known Allergies.  Meds: Current Outpatient Medications  Medication Sig Dispense Refill   FARXIGA 10 MG TABS tablet Take 10 mg by mouth daily.     metFORMIN (GLUCOPHAGE) 1000 MG tablet Take 1,000 mg by mouth 2 (two) times daily with a meal.     No current facility-administered medications for this encounter.    Physical Findings: The patient is in no acute distress. Patient is alert and oriented.  height is '5\' 10"'$  (1.778 m) and weight is 172 lb 9.6 oz (78.3 kg). His temperature is 98.1 F (36.7 C). His blood pressure is 117/82 and his pulse is 76. His respiration is 18 and oxygen saturation is 98%. .    Satisfactory healing at forehead, no  sign of local recurrence.  Lab Findings: Lab Results  Component Value Date   WBC 9.7 06/07/2016   HGB 15.8 06/07/2016   HCT 44.0 06/07/2016   MCV 91.3 06/07/2016   PLT 254 06/07/2016    Radiographic Findings: No results found.  Impression/Plan:  healed well from radiation therapy  We will refer him back to his general dermatology for surveillance. I'll see him back PRN.  On date of service, in total, I spent 15 minutes on this encounter. Patient was seen in person.  Note signed after date of encounter - minutes pertain to date of visit only. _____________________________________   Eppie Gibson, MD

## 2023-06-07 IMAGING — NM NM LYMPHATICS/LYMPH NODE
12 series · 12 of 12 positions shown · non-contrast
Comparison: CT neck 06/07/2016

CLINICAL DATA: Skin cancer LEFT frontal region excised November 2021

EXAM:
NUCLEAR MEDICINE LYMPHANGIOGRAPHY
TECHNIQUE: Sequential images were obtained following intradermal injection of
radiopharmaceutical at 4 points surrounding the surgical site in the
LEFT frontal scalp.
RADIOPHARMACEUTICALS:  502 microcuries Rc-RRm Lymphoseek

[Series 1: ant-post · 2.07mm/px · 1 of 1 slices shown]
[im 1/1]
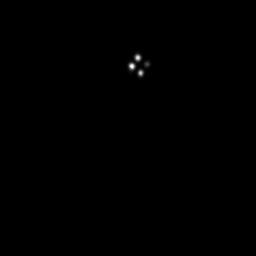

[Series 2: imm ant trans · 2.07mm/px · 1 of 1 slices shown]
[im 1/1]
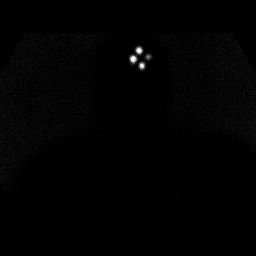

[Series 3: imm lao · 2.07mm/px · 1 of 1 slices shown]
[im 1/1]
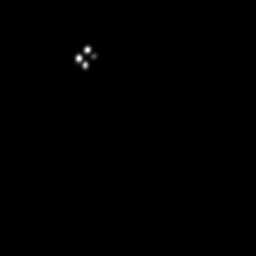

[Series 4: imm lao trans · 2.07mm/px · 1 of 1 slices shown]
[im 1/1]
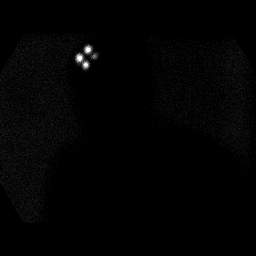

[Series 5: imm lt lat · 2.07mm/px · 1 of 1 slices shown]
[im 1/1]
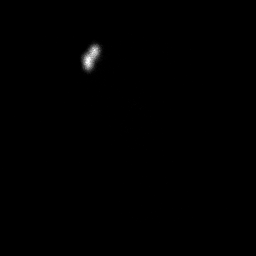

[Series 6: imm lt lat trans · 2.07mm/px · 1 of 1 slices shown]
[im 1/1]
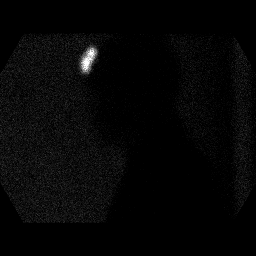

[Series 7: 60 min ant · 2.07mm/px · 1 of 1 slices shown]
[im 1/1]
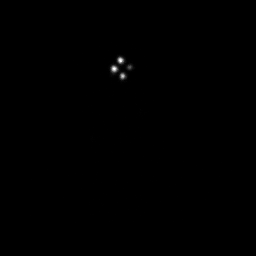

[Series 8: 60 min ant trans · 2.07mm/px · 1 of 1 slices shown]
[im 1/1]
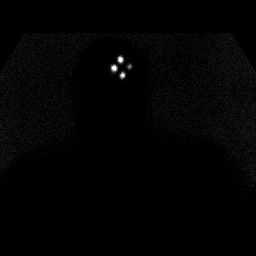

[Series 9: 60 min lao · 2.07mm/px · 1 of 1 slices shown]
[im 1/1]
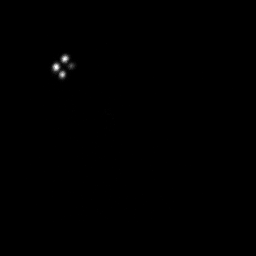

[Series 10: 60 min lao trans · 2.07mm/px · 1 of 1 slices shown]
[im 1/1]
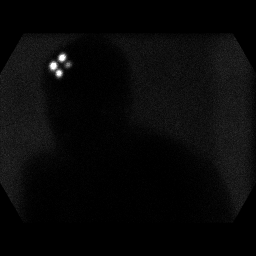

[Series 11: 60 min lt lat · 2.07mm/px · 1 of 1 slices shown]
[im 1/1]
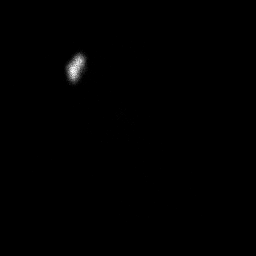

[Series 12: 60 lt lat trans · 2.07mm/px · 1 of 1 slices shown]
[im 1/1]
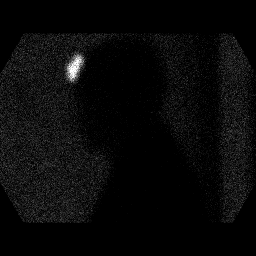

[12 of 12 positions shown; findings below may reference images not displayed]

FINDINGS: Residual tracer is seen at the 4 injection sites surrounding the
operative site at the LEFT frontal scalp.

Faint tracer uptake is identified at a site at near the LEFT mastoid
air cells as well as a focus in the RIGHT submandibular region.
IMPRESSION: Tracer localization is seen at the RIGHT submandibular region and at
the LEFT upper neck near the LEFT mastoid air cells.

## 2024-06-04 ENCOUNTER — Encounter: Payer: Self-pay | Admitting: Podiatry

## 2024-06-04 ENCOUNTER — Ambulatory Visit: Admitting: Podiatry

## 2024-06-04 DIAGNOSIS — M79672 Pain in left foot: Secondary | ICD-10-CM

## 2024-06-04 DIAGNOSIS — M7752 Other enthesopathy of left foot: Secondary | ICD-10-CM | POA: Diagnosis not present

## 2024-06-04 DIAGNOSIS — B351 Tinea unguium: Secondary | ICD-10-CM

## 2024-06-04 DIAGNOSIS — M79671 Pain in right foot: Secondary | ICD-10-CM

## 2024-06-04 NOTE — Progress Notes (Signed)
 Patient presents for evaluation and treatment of tenderness and some redness around nails feet.  Tenderness around toes with walking and wearing shoes.  He does not complain of any burning or numbness in the feet.  Physical exam:  General appearance: Alert, pleasant, and in no acute distress.  Vascular: Pedal pulses: DP 2/4 B/L, PT 1/4 B/L.  Mild to moderate edema lower legs bilaterally.  Capillary refill time immediate bilaterally  Neurological:  L late.  Ight touch intact.  Vibratory sensation diminished bilaterally.  Achilles tendon reflex normal bilateral.  Monofilament sensation intact bilaterally  Dermatologic:  Nails thickened, disfigured, discolored 1-5 BL with subungual debris.  Redness and hypertrophic nail folds along nail folds bilaterally but no signs of drainage or infection.  Skin thin and atrophic with no hair growth in the lower extremity.  Some areas of hyperpigmentation bilaterally.  Musculoskeletal:  Hammertoes 2 through 5 bilaterally.  Normal lower extremity muscle strength bilaterally.  Some soreness over the dorsal medial aspect of the IPJ of the hallux left.  Bothers him sometimes with wearing shoes.   Diagnosis: 1. Painful onychomycotic nails 1 through 5 bilaterally. 2. Pain toes 1 through 5 bilaterally. 3.  Bursitis hallux left  Plan: -New patient office visit for evaluation and management level 3.  Modifier 25 -Discussed pain in the dorsal medial aspect at the inner phalangeal joint of the hallux left.  Recommend shoes that do not rub the area he can use a Endocutter nonmedicated padding on the area.    Wear socks with good cushioning in the toes.  Discussed precautions and need to take as a diabetic and things to look out for that could indicate problems.  If he does does notice any problems he is to call immediately -Debrided onychomycotic nails 1 through 5 bilaterally.  Return 3 months [facility]

## 2024-09-04 ENCOUNTER — Encounter: Payer: Self-pay | Admitting: Podiatry

## 2024-09-04 ENCOUNTER — Ambulatory Visit: Admitting: Podiatry

## 2024-09-04 DIAGNOSIS — M79671 Pain in right foot: Secondary | ICD-10-CM | POA: Diagnosis not present

## 2024-09-04 DIAGNOSIS — M79672 Pain in left foot: Secondary | ICD-10-CM

## 2024-09-04 DIAGNOSIS — B351 Tinea unguium: Secondary | ICD-10-CM | POA: Diagnosis not present

## 2024-09-04 NOTE — Progress Notes (Signed)
 Patient presents for evaluation and treatment of tenderness and some redness around nails feet.  Tenderness around toes with walking and wearing shoes.  Physical exam:  General appearance: Alert, pleasant, and in no acute distress.  Vascular: Pedal pulses: DP 2/4 B/L, PT 1/4 B/L. Mild-moderate edema lower legs bilaterally  Neurologic:  Dermatologic:  Nails thickened, disfigured, discolored 1-5 BL with subungual debris.  Redness and hypertrophic nail folds along nail folds bilaterally but no signs of drainage or infection.  Musculoskeletal:     Diagnosis: 1. Painful onychomycotic nails 1 through 5 bilaterally. 2. Pain toes 1 through 5 bilaterally.  Plan: -Debrided onychomycotic nails 1 through 5 bilaterally.  Sharply debrided nails with nail clipper and reduced with a power bur.  Return 3 months [facility]

## 2024-12-05 ENCOUNTER — Encounter: Payer: Self-pay | Admitting: Podiatry

## 2024-12-05 ENCOUNTER — Ambulatory Visit (INDEPENDENT_AMBULATORY_CARE_PROVIDER_SITE_OTHER): Admitting: Podiatry

## 2024-12-05 DIAGNOSIS — M79674 Pain in right toe(s): Secondary | ICD-10-CM

## 2024-12-05 DIAGNOSIS — B351 Tinea unguium: Secondary | ICD-10-CM

## 2024-12-05 DIAGNOSIS — M79675 Pain in left toe(s): Secondary | ICD-10-CM

## 2024-12-05 NOTE — Progress Notes (Signed)
 Patient presents for evaluation and treatment of tenderness and some redness around nails feet.  Tenderness around toes with walking and wearing shoes.  Physical exam:  General appearance: Alert, pleasant, and in no acute distress.  Vascular: Pedal pulses: DP 2/4 B/L, PT 1/4 B/L.  Mild-moderate edema lower legs bilaterally.  Capillary refill time immediate bilaterally  Neurologic:  Dermatologic:  Nails thickened, disfigured, discolored 1-5 BL with subungual debris.  Redness and hypertrophic nail folds along nail folds bilaterally but no signs of drainage or infection.  Musculoskeletal:     Diagnosis: 1. Painful onychomycotic nails 1 through 5 bilaterally. 2. Pain toes 1 through 5 bilaterally.  Plan: -Debrided onychomycotic nails 1 through 5 bilaterally.  Sharply debrided nails with nail clipper and reduced with a power bur.  Return 3 months [facility]

## 2025-03-06 ENCOUNTER — Ambulatory Visit: Admitting: Podiatry
